# Patient Record
Sex: Female | Born: 1965 | Race: Black or African American | Hispanic: No | Marital: Married | Smoking: Former smoker
Health system: Southern US, Community
[De-identification: ages and names within clinical notes are randomized; demographics above are authoritative.]

## PROBLEM LIST (undated history)

## (undated) DIAGNOSIS — M1990 Unspecified osteoarthritis, unspecified site: Secondary | ICD-10-CM

## (undated) DIAGNOSIS — F419 Anxiety disorder, unspecified: Secondary | ICD-10-CM

## (undated) DIAGNOSIS — D219 Benign neoplasm of connective and other soft tissue, unspecified: Secondary | ICD-10-CM

## (undated) DIAGNOSIS — I10 Essential (primary) hypertension: Secondary | ICD-10-CM

## (undated) DIAGNOSIS — F32A Depression, unspecified: Secondary | ICD-10-CM

## (undated) DIAGNOSIS — R7303 Prediabetes: Secondary | ICD-10-CM

## (undated) DIAGNOSIS — F4310 Post-traumatic stress disorder, unspecified: Secondary | ICD-10-CM

## (undated) DIAGNOSIS — D649 Anemia, unspecified: Secondary | ICD-10-CM

## (undated) DIAGNOSIS — E119 Type 2 diabetes mellitus without complications: Secondary | ICD-10-CM

## (undated) HISTORY — DX: Anxiety disorder, unspecified: F41.9

## (undated) HISTORY — DX: Benign neoplasm of connective and other soft tissue, unspecified: D21.9

## (undated) HISTORY — DX: Post-traumatic stress disorder, unspecified: F43.10

## (undated) HISTORY — DX: Anemia, unspecified: D64.9

## (undated) HISTORY — DX: Essential (primary) hypertension: I10

## (undated) HISTORY — DX: Type 2 diabetes mellitus without complications: E11.9

---

## 2015-09-30 ENCOUNTER — Telehealth: Payer: Self-pay | Admitting: Obstetrics and Gynecology

## 2015-09-30 NOTE — Telephone Encounter (Signed)
Called and left a message for patient to call back to schedule a new patient doctor referral. °

## 2015-10-01 NOTE — Telephone Encounter (Signed)
Called but could not leave a message for patient to call back to schedule a new patient doctor referral because her voice mail was full.

## 2015-10-02 NOTE — Telephone Encounter (Signed)
Called but could not leave a message for patient to call back to schedule a new patient doctor referral because her voice mail was full.

## 2015-10-03 NOTE — Telephone Encounter (Signed)
Patient did not return repeated calls to schedule an appointment. Routing referral by fax back to referring office for them to follow up with the patient.

## 2015-10-30 ENCOUNTER — Encounter: Payer: BC Managed Care – PPO | Admitting: Obstetrics and Gynecology

## 2015-11-06 ENCOUNTER — Encounter: Payer: BC Managed Care – PPO | Admitting: Obstetrics and Gynecology

## 2015-11-06 ENCOUNTER — Ambulatory Visit (INDEPENDENT_AMBULATORY_CARE_PROVIDER_SITE_OTHER): Payer: BC Managed Care – PPO | Admitting: Obstetrics and Gynecology

## 2015-11-06 ENCOUNTER — Encounter: Payer: Self-pay | Admitting: Obstetrics and Gynecology

## 2015-11-06 VITALS — BP 118/64 | HR 88 | Resp 16 | Ht 60.5 in | Wt 294.0 lb

## 2015-11-06 DIAGNOSIS — Z01419 Encounter for gynecological examination (general) (routine) without abnormal findings: Secondary | ICD-10-CM | POA: Diagnosis not present

## 2015-11-06 DIAGNOSIS — Z124 Encounter for screening for malignant neoplasm of cervix: Secondary | ICD-10-CM

## 2015-11-06 DIAGNOSIS — B977 Papillomavirus as the cause of diseases classified elsewhere: Secondary | ICD-10-CM | POA: Diagnosis not present

## 2015-11-06 DIAGNOSIS — R888 Abnormal findings in other body fluids and substances: Secondary | ICD-10-CM

## 2015-11-06 DIAGNOSIS — IMO0002 Reserved for concepts with insufficient information to code with codable children: Secondary | ICD-10-CM

## 2015-11-06 NOTE — Patient Instructions (Signed)
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
## 2015-11-12 LAB — IPS PAP TEST WITH HPV

## 2015-11-14 NOTE — Addendum Note (Signed)
Addended by: Dorothy Spark on: 11/14/2015 02:57 PM   Modules accepted: Orders

## 2015-11-15 LAB — IPS HPV GENOTYPING 16/18

## 2015-11-21 ENCOUNTER — Telehealth: Payer: Self-pay

## 2015-11-21 NOTE — Telephone Encounter (Signed)
Spoke with patient. Results given as seen below from [place]. All questions answered. Offered to schedule an appointment with Dr.Jertson to discuss. Patient declines appointment at this time. 08 recall placed.  Routing to provider for final review. Patient agreeable to disposition. Will close encounter.

## 2015-11-21 NOTE — Telephone Encounter (Signed)
Left message to call Reagyn Facemire at [phone]. 

## 2015-11-21 NOTE — Telephone Encounter (Signed)
-----   Message from Salvadore Dom, MD sent at 11/20/2015 10:44 AM EST ----- Please inform the patient that her pap was negative, but the hpv test was positive. It was then further tested for the higher risk strains of hpv and those were negative. She needs another pap with hpv in 1 year.

## 2016-12-02 ENCOUNTER — Telehealth: Payer: Self-pay | Admitting: *Deleted

## 2016-12-02 NOTE — Telephone Encounter (Signed)
Patient in 08 recall 11/2016. Tried to contact patient- VM full- will try back -eh

## 2016-12-07 NOTE — Telephone Encounter (Signed)
Please send the patient a letter reminding her that she needs a f/u pap this year (if she has another provider, that's fine, just please let us know).

## 2016-12-07 NOTE — Telephone Encounter (Signed)
Tried to contact patient again- however VM is still full. Please advise on recall status/letter -eh

## 2016-12-08 ENCOUNTER — Encounter: Payer: Self-pay | Admitting: *Deleted

## 2016-12-08 NOTE — Telephone Encounter (Signed)
Recall letter sent to patient -eh

## 2017-02-25 ENCOUNTER — Encounter: Payer: Self-pay | Admitting: Registered"

## 2017-02-25 ENCOUNTER — Encounter: Payer: Self-pay | Attending: Physician Assistant | Admitting: Registered"

## 2017-02-25 DIAGNOSIS — Z713 Dietary counseling and surveillance: Secondary | ICD-10-CM | POA: Insufficient documentation

## 2017-02-25 DIAGNOSIS — E669 Obesity, unspecified: Secondary | ICD-10-CM

## 2017-02-25 NOTE — Progress Notes (Signed)
Medical Nutrition Therapy:  Appt start time: 1640 end time:  8295.   Assessment:  Primary concerns today: Pt referred for failure to lose weight . Pt says she has lost ~150 over a seven year period using weight watchers primarily in the past. Per pt meats makes her become bloated. Per pt fish gives her gout, but she eats it sometimes. Pt says she feels bad if she drinks milk and does not like it. Pt says she eats vegetarian most of the time and sometimes poultry and fish. Pt has tried gluten-free diet because per pt she is concerned it affects her uric acid levels. Pt says she currently consumes a protein shake in the morning with a total of 45g of protein. Pt says her husband has lost weight as well and they both work out together.   Preferred Learning Style:   No preferred style indicated  Learning Readiness:  Ready  MEDICATIONS: See list   DIETARY INTAKE:  Usual eating pattern includes 2 meals and sometimes 1 snacks per day.  Everyday foods include beans, nuts.  Avoided foods include milk, red meats, tofu.    24-hr recall:  B ( AM): 2 100 kcal packs of almonds, banana or other fruit, water or a protein shake Snk ( AM):  None reported  L ( PM): None unless pt skips breakfast then it will be pre-made meal Snk ( PM): Small snack of skittles but pt says she does not usually eat afternoon snack D ( PM): vegetarian spaghetti with gluten free noodles and kale caesar salad Snk ( PM): None reported.  Beverages: water, crystal lite with lemon carbonated water, stevia sweetened  Pt says she usually eats at the table.  Pt says on the weekends she cooks at home.  Pt says she feels like she is starving when she gets home at dinner and it sometimes causes her to overeat at dinner and crave carbohydrates. Pt likes beans but does not like tofu.   Usual physical activity: Walks about 2-5,000 steps per day per pt and does 15-20 minutes weight baring exercises every week day.    Progress  Towards Goal(s):  In progress.   Nutritional Diagnosis:  NI-5.11.1 Predicted suboptimal nutrient intake As related to pt only consuming 2 meals per day.  As evidenced by pt diet recall.    Intervention:  Nutrition Counseling provided. Dietitian counseled pt on importance of eating 3 regular meals each day to provide adequate energy and to promote balanced metabolism. Dietitian talked to pt about importance of eating a balanced diet and nourishing her body and focusing on health rather than weight. Dietitian provided suggestions on how to plan balanced meals. Dietitian discussed sources of vitamin D in diet and through sunlight and suggested pt consume probiotics to promote digestive health.   Goals:   -Get in three meals per day and a snack if there are long periods between meals.   -Breakfast ideas-toast with peanut butter and a piece of fruit, egg with toast and a piece of fruit, yogurt with granola and a piece of fruit, or protein shake if more convenient and unable to make a breakfast.   -Get in lunch in the middle of the day to provide sufficient energy and to help avoid feeling too hungry in the evening. Make sure to include some carbohydrates in each meals to provide energy for the brain and body.   -Try to make sure to get in water without caffeine throughout the day to ensure proper hydration.   -  Try Culturelle probiotics, yogurt with live active cultures, or Kefir.   -Continue getting in regular physical activity.   Teaching Method Utilized:  Auditory   Handouts given during visit include:  My Plate Handout  Barriers to learning/adherence to lifestyle change: Busy lifestyle.  Demonstrated degree of understanding via:  Teach Back   Monitoring/Evaluation:  Dietary intake, exercise,  and body weight in 1 month(s).

## 2017-02-25 NOTE — Patient Instructions (Addendum)
Get in three meals per day and a snack if there are long periods between meals.   Breakfast ideas-toast with peanut butter and a piece of fruit, egg with toast and a piece of fruit, yogurt with granola and a piece of fruit, or protein shake if more convenient and unable to make a breakfast.   Get in lunch in the middle of the day to provide sufficient energy and to help avoid feeling too hungry in the evening. Make sure to include some carbohydrates in each meals to provide energy for the brain and body.   Try to make sure to get in water without caffeine throughout the day to ensure proper hydration.   Try Culturelle probiotics, yogurt with live active cultures, or Kefir.   Continue getting in regular physical activity.

## 2017-03-23 ENCOUNTER — Ambulatory Visit: Payer: Self-pay | Admitting: Registered"

## 2018-01-08 ENCOUNTER — Ambulatory Visit (HOSPITAL_COMMUNITY)
Admission: EM | Admit: 2018-01-08 | Discharge: 2018-01-08 | Disposition: A | Discharge status: Home / Self Care | Payer: BC Managed Care – PPO | Attending: Family Medicine | Admitting: Family Medicine

## 2018-01-08 ENCOUNTER — Encounter (HOSPITAL_COMMUNITY): Payer: Self-pay | Admitting: Family Medicine

## 2018-01-08 DIAGNOSIS — N12 Tubulo-interstitial nephritis, not specified as acute or chronic: Secondary | ICD-10-CM | POA: Diagnosis not present

## 2018-01-08 DIAGNOSIS — R509 Fever, unspecified: Secondary | ICD-10-CM | POA: Diagnosis not present

## 2018-01-08 DIAGNOSIS — R300 Dysuria: Secondary | ICD-10-CM

## 2018-01-08 LAB — POCT URINALYSIS DIP (DEVICE)
Bilirubin Urine: NEGATIVE
Glucose, UA: NEGATIVE mg/dL
HGB URINE DIPSTICK: NEGATIVE
Ketones, ur: NEGATIVE mg/dL
Leukocytes, UA: NEGATIVE
NITRITE: NEGATIVE
PH: 7.5 (ref 5.0–8.0)
Protein, ur: NEGATIVE mg/dL
SPECIFIC GRAVITY, URINE: 1.015 (ref 1.005–1.030)
UROBILINOGEN UA: 0.2 mg/dL (ref 0.0–1.0)

## 2018-01-08 MED ORDER — CEFTRIAXONE SODIUM 1 G IJ SOLR
1.0000 g | Freq: Once | INTRAMUSCULAR | Status: AC
  Administered 2018-01-08: 1 g via INTRAMUSCULAR

## 2018-01-08 MED ORDER — LIDOCAINE HCL (PF) 1 % IJ SOLN
INTRAMUSCULAR | Status: AC
Start: 2018-01-08 — End: 2018-01-08
  Filled 2018-01-08: qty 2

## 2018-01-08 MED ORDER — CEFTRIAXONE SODIUM 1 G IJ SOLR
INTRAMUSCULAR | Status: AC
  Filled 2018-01-08: qty 10

## 2018-01-08 MED ORDER — SULFAMETHOXAZOLE-TRIMETHOPRIM 800-160 MG PO TABS: 1.0000 | ORAL_TABLET | Freq: Two times a day (BID) | ORAL | 0 refills | Status: AC

## 2018-01-08 NOTE — ED Provider Notes (Signed)
  [facility]    CSN: [id number] Arrival date & time: 01/08/18  6195  Chief Complaint  Patient presents with  . Fever  . Back Pain    Evelyn Butler is a 52 y.o. female here for possible UTI.  Duration: 6 days. Symptoms: fever, flank pain on right and dysuria Denies: urinary frequency, hematuria, urinary hesitancy, urinary retention, nausea, vomiting and URI s/s's, vaginal discharge Hx of recurrent UTI? No Denies new sexual partners. She was treated for a UTI 6 days ago with Macrobid, they were unable to tell her what organism was causing her UTI on culture.  She started having fevers and flank pain 2 days ago.  ROS:  Constitutional: +fever GU: As noted in HPI  Past Medical History:  Diagnosis Date  . Anemia   . Diabetes mellitus without complication ([place])   . Fibroid   . Hypertension     BP 135/76   Pulse 92   Temp 99.3 F (37.4 C)   Resp 18   Wt 288 lb (130.6 kg)   SpO2 100%   BMI 52.68 kg/m  General: Awake, alert, appears stated age HEENT: MMM Heart: RRR, no murmurs Lungs: CTAB, normal respiratory effort, no accessory muscle usage Abd: BS+, soft, NT, ND, no masses or organomegaly MSK: +R CVA tenderness Psych: Age appropriate judgment and insight  Pyelonephritis  UA neg. 1 g Rocephin given in clinic, start Bactrim DS bid for 7 d tomorrow.  Stay hydrated. Seek immediate care if pt starts to develop new/worsening symptoms, uncontrollable N/V. F/u w pcp prn. The patient voiced understanding and agreement to the plan.    Shelda Pal, [place] 01/08/18 2151

## 2018-01-08 NOTE — ED Triage Notes (Addendum)
Pt here for fever and back pain. Was treated for UTI last Sunday. She has been taking Macrobid. sts Thursday she began to have a fever 101.5. Pt also has bilateral LE pain. Reports hx of the same with cellulitis. Took tylenol 2 hours ago.

## 2018-01-09 ENCOUNTER — Telehealth (HOSPITAL_COMMUNITY): Payer: Self-pay | Admitting: Emergency Medicine

## 2018-01-09 MED ORDER — CEPHALEXIN 500 MG PO CAPS: 500.0000 mg | ORAL_CAPSULE | Freq: Four times a day (QID) | ORAL | 0 refills | Status: AC

## 2018-01-09 NOTE — Telephone Encounter (Signed)
Called in RX to [facility] on [address]

## 2018-01-09 NOTE — Telephone Encounter (Signed)
Pt called and is allergic to sulfa and needs new antibiotic called into pharmacy

## 2018-05-29 ENCOUNTER — Encounter (HOSPITAL_COMMUNITY): Payer: Self-pay

## 2018-05-29 ENCOUNTER — Emergency Department (HOSPITAL_COMMUNITY)
Admission: EM | Admit: 2018-05-29 | Discharge: 2018-05-29 | Disposition: A | Discharge status: Home / Self Care | Payer: BC Managed Care – PPO | Attending: Emergency Medicine | Admitting: Emergency Medicine

## 2018-05-29 ENCOUNTER — Emergency Department (HOSPITAL_COMMUNITY): Payer: BC Managed Care – PPO

## 2018-05-29 ENCOUNTER — Other Ambulatory Visit: Payer: Self-pay

## 2018-05-29 DIAGNOSIS — Z87891 Personal history of nicotine dependence: Secondary | ICD-10-CM | POA: Diagnosis not present

## 2018-05-29 DIAGNOSIS — M1612 Unilateral primary osteoarthritis, left hip: Secondary | ICD-10-CM | POA: Diagnosis not present

## 2018-05-29 DIAGNOSIS — M25561 Pain in right knee: Secondary | ICD-10-CM

## 2018-05-29 DIAGNOSIS — W19XXXA Unspecified fall, initial encounter: Secondary | ICD-10-CM

## 2018-05-29 DIAGNOSIS — M1711 Unilateral primary osteoarthritis, right knee: Secondary | ICD-10-CM | POA: Diagnosis not present

## 2018-05-29 DIAGNOSIS — E119 Type 2 diabetes mellitus without complications: Secondary | ICD-10-CM | POA: Insufficient documentation

## 2018-05-29 DIAGNOSIS — I10 Essential (primary) hypertension: Secondary | ICD-10-CM | POA: Insufficient documentation

## 2018-05-29 DIAGNOSIS — Z79899 Other long term (current) drug therapy: Secondary | ICD-10-CM | POA: Insufficient documentation

## 2018-05-29 DIAGNOSIS — M25552 Pain in left hip: Secondary | ICD-10-CM

## 2018-05-29 MED ORDER — PREDNISONE 10 MG (21) PO TBPK: ORAL_TABLET | Freq: Every day | ORAL | 0 refills | Status: DC

## 2018-05-29 MED ORDER — DEXAMETHASONE SODIUM PHOSPHATE 10 MG/ML IJ SOLN
10.0000 mg | Freq: Once | INTRAMUSCULAR | Status: AC
  Administered 2018-05-29: 10 mg via INTRAMUSCULAR
  Filled 2018-05-29: qty 1

## 2018-05-29 NOTE — ED Notes (Signed)
Pt verbalized understanding of discharge instructions and denies any further questions at this time.   

## 2018-05-29 NOTE — Discharge Instructions (Addendum)
Thank you for allowing me to care for you today in the Emergency Department.   Starting tomorrow, take 6 tabs of prednisone by mouth daily  for 2 days, then 5 tabs for 2 days, then 4 tabs for 2 days, then 3 tabs for 2 days, 2 tabs for 2 days, then 1 tab by mouth daily for 2 days.  Please note, this medication can cause her blood sugar to increase.  You can also try applying capsaicin cream, which is available over-the-counter, to areas that are sore.  Use as directed on the label.  You can take at thousand milligrams of regular Tylenol every 8 hours.  If you use extra strength or Tylenol for arthritis, use as directed on the label.  Apply ice for 15 to 20 minutes as frequently as needed to help with pain and swelling.  Wear the knee sleeve as needed to provide compression to help with pain.  Call to schedule follow-up appointment with Dr. Stann Mainland, and orthopedist, if your symptoms do not start to improve in the next 5 to 7 days.  Return to the emergency department if you develop new or worsening symptoms including any fall or injury, new numbness or weakness, or if any of the joints get red, hot, or swollen to the touch.

## 2018-05-29 NOTE — ED Notes (Addendum)
Patient transported to XR. 
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
## 2018-05-29 NOTE — ED Notes (Signed)
ED Provider at bedside. 

## 2020-08-01 ENCOUNTER — Other Ambulatory Visit: Payer: Self-pay | Admitting: Family Medicine

## 2020-08-02 ENCOUNTER — Other Ambulatory Visit: Payer: Self-pay | Admitting: Family Medicine

## 2020-08-02 DIAGNOSIS — Z1231 Encounter for screening mammogram for malignant neoplasm of breast: Secondary | ICD-10-CM

## 2021-06-20 NOTE — Patient Instructions (Addendum)
DUE TO COVID-19 ONLY ONE VISITOR IS ALLOWED TO COME WITH YOU AND STAY IN THE WAITING ROOM ONLY DURING PRE OP AND PROCEDURE DAY OF SURGERY. THE 2 VISITORS  MAY VISIT WITH YOU AFTER SURGERY IN YOUR PRIVATE ROOM DURING VISITING HOURS ONLY!  YOU NEED TO HAVE A COVID 19 TEST ON__8/29_____ THIS TEST MUST BE DONE BEFORE SURGERY,                 Evelyn Butler [facility]     Your procedure is scheduled on: 07/02/21   Report to [facility] Main  Entrance   Report to admitting at  6:45 AM     Call this number if you have problems the morning of surgery [phone]   . BRUSH YOUR TEETH MORNING OF SURGERY AND RINSE YOUR MOUTH OUT, NO CHEWING GUM CANDY OR MINTS.   No food after midnight.    You may have clear liquid until 6:30 AM.    At 6:00 AM drink pre surgery drink.   Nothing by mouth after 6:30 AM.     Take these medicines the morning of surgery with A SIP OF WATER: none                                You may not have any metal on your body including hair pins and              piercings  Do not wear jewelry, make-up, lotions, powders or perfumes, deodorant             Do not wear nail polish on your fingernails and toes .  Do not shave  48 hours prior to surgery.                 Do not bring valuables to the hospital. [place].  Contacts, dentures or bridgework may not be worn into surgery.  Leave suitcase in the car. After surgery it may be brought to your room.      Special Instructions: N/A              Please read over the following fact sheets you were given: _____________________________________________________________________             [facility] - Preparing for Surgery Before surgery, you can play an important role.  Because skin is not sterile, your skin needs to be as free of germs as possible.  You can reduce the number of germs on your skin by washing with CHG (chlorahexidine gluconate) soap before  surgery.  CHG is an antiseptic cleaner which kills germs and bonds with the skin to continue killing germs even after washing. Please DO NOT use if you have an allergy to CHG or antibacterial soaps.  If your skin becomes reddened/irritated stop using the CHG and inform your nurse when you arrive at Short Stay. Do not shave (including legs and underarms) for at least 48 hours prior to the first CHG shower.   Please follow these instructions carefully:  1.  Shower with CHG Soap the night before surgery and the  morning of Surgery.  2.  If you choose to wash your hair, wash your hair first as usual with your  normal  shampoo.  3.  After you shampoo, rinse your hair and body thoroughly to remove the  shampoo.                            4.  Use CHG as you would any other liquid soap.  You can apply chg directly  to the skin and wash                       Gently with a scrungie or clean washcloth.  5.  Apply the CHG Soap to your body ONLY FROM THE NECK DOWN.   Do not use on face/ open                           Wound or open sores. Avoid contact with eyes, ears mouth and genitals (private parts).                       Wash face,  Genitals (private parts) with your normal soap.             6.  Wash thoroughly, paying special attention to the area where your surgery  will be performed.  7.  Thoroughly rinse your body with warm water from the neck down.  8.  DO NOT shower/wash with your normal soap after using and rinsing off  the CHG Soap.             9.  Pat yourself dry with a clean towel.            10.  Wear clean pajamas.            11.  Place clean sheets on your bed the night of your first shower and do not  sleep with pets. Day of Surgery : Do not apply any lotions/deodorants the morning of surgery.  Please wear clean clothes to the hospital/surgery center.  FAILURE TO FOLLOW THESE INSTRUCTIONS MAY RESULT IN THE CANCELLATION OF YOUR SURGERY PATIENT  SIGNATURE_________________________________  NURSE SIGNATURE__________________________________  ________________________________________________________________________   Adam Phenix  An incentive spirometer is a tool that can help keep your lungs clear and active. This tool measures how well you are filling your lungs with each breath. Taking long deep breaths may help reverse or decrease the chance of developing breathing (pulmonary) problems (especially infection) following: A long period of time when you are unable to move or be active. BEFORE THE PROCEDURE  If the spirometer includes an indicator to show your best effort, your nurse or respiratory therapist will set it to a desired goal. If possible, sit up straight or lean slightly forward. Try not to slouch. Hold the incentive spirometer in an upright position. INSTRUCTIONS FOR USE  Sit on the edge of your bed if possible, or sit up as far as you can in bed or on a chair. Hold the incentive spirometer in an upright position. Breathe out normally. Place the mouthpiece in your mouth and seal your lips tightly around it. Breathe in slowly and as deeply as possible, raising the piston or the ball toward the top of the column. Hold your breath for 3-5 seconds or for as long as possible. Allow the piston or ball to fall to the bottom of the column. Remove the mouthpiece from your mouth and breathe out normally. Rest for a few seconds and repeat Steps 1 through 7 at least 10 times every 1-2 hours when you are awake. Take your time and take a few normal  breaths between deep breaths. The spirometer may include an indicator to show your best effort. Use the indicator as a goal to work toward during each repetition. After each set of 10 deep breaths, practice coughing to be sure your lungs are clear. If you have an incision (the cut made at the time of surgery), support your incision when coughing by placing a pillow or rolled up towels  firmly against it. Once you are able to get out of bed, walk around indoors and cough well. You may stop using the incentive spirometer when instructed by your caregiver.  RISKS AND COMPLICATIONS Take your time so you do not get dizzy or light-headed. If you are in pain, you may need to take or ask for pain medication before doing incentive spirometry. It is harder to take a deep breath if you are having pain. AFTER USE Rest and breathe slowly and easily. It can be helpful to keep track of a log of your progress. Your caregiver can provide you with a simple table to help with this. If you are using the spirometer at home, follow these instructions: [place] IF:  You are having difficultly using the spirometer. You have trouble using the spirometer as often as instructed. Your pain medication is not giving enough relief while using the spirometer. You develop fever of 100.5 F (38.1 C) or higher. SEEK IMMEDIATE MEDICAL CARE IF:  You cough up bloody sputum that had not been present before. You develop fever of 102 F (38.9 C) or greater. You develop worsening pain at or near the incision site. MAKE SURE YOU:  Understand these instructions. Will watch your condition. Will get help right away if you are not doing well or get worse. Document Released: 03/01/2007 Document Revised: 01/11/2012 Document Reviewed: 05/02/2007 [facility] Patient Information 2014 [place], [place].   ________________________________________________________________________

## 2021-06-23 ENCOUNTER — Encounter (HOSPITAL_COMMUNITY): Payer: Self-pay

## 2021-06-23 ENCOUNTER — Encounter (HOSPITAL_COMMUNITY)
Admission: RE | Admit: 2021-06-23 | Discharge: 2021-06-23 | Disposition: A | Discharge status: Home / Self Care | Payer: BC Managed Care – PPO | Source: Ambulatory Visit | Attending: Orthopedic Surgery | Admitting: Orthopedic Surgery

## 2021-06-23 ENCOUNTER — Other Ambulatory Visit: Payer: Self-pay

## 2021-06-23 DIAGNOSIS — Z01812 Encounter for preprocedural laboratory examination: Secondary | ICD-10-CM | POA: Diagnosis present

## 2021-06-23 HISTORY — DX: Prediabetes: R73.03

## 2021-06-23 LAB — CBC
HCT: 36.9 % (ref 36.0–46.0)
Hemoglobin: 12.1 g/dL (ref 12.0–15.0)
MCH: 28.7 pg (ref 26.0–34.0)
MCHC: 32.8 g/dL (ref 30.0–36.0)
MCV: 87.4 fL (ref 80.0–100.0)
Platelets: 302 10*3/uL (ref 150–400)
RBC: 4.22 MIL/uL (ref 3.87–5.11)
RDW: 13.7 % (ref 11.5–15.5)
WBC: 5 10*3/uL (ref 4.0–10.5)
nRBC: 0 % (ref 0.0–0.2)

## 2021-06-23 LAB — PROTIME-INR
INR: 1 (ref 0.8–1.2)
Prothrombin Time: 12.9 seconds (ref 11.4–15.2)

## 2021-06-23 LAB — COMPREHENSIVE METABOLIC PANEL
ALT: 16 U/L (ref 0–44)
AST: 16 U/L (ref 15–41)
Albumin: 4.3 g/dL (ref 3.5–5.0)
Alkaline Phosphatase: 60 U/L (ref 38–126)
Anion gap: 6 (ref 5–15)
BUN: 31 mg/dL — ABNORMAL HIGH (ref 6–20)
CO2: 24 mmol/L (ref 22–32)
Calcium: 9.3 mg/dL (ref 8.9–10.3)
Chloride: 105 mmol/L (ref 98–111)
Creatinine, Ser: 1.01 mg/dL — ABNORMAL HIGH (ref 0.44–1.00)
GFR, Estimated: >60 mL/min (ref >60)
Glucose, Bld: 83 mg/dL (ref 70–99)
Potassium: 4.6 mmol/L (ref 3.5–5.1)
Sodium: 135 mmol/L (ref 135–145)
Total Bilirubin: 0.9 mg/dL (ref 0.3–1.2)
Total Protein: 8 g/dL (ref 6.5–8.1)

## 2021-06-23 LAB — HEMOGLOBIN A1C
Hgb A1c MFr Bld: 4.9 % (ref 4.8–5.6)
Mean Plasma Glucose: 93.93 mg/dL

## 2021-06-23 LAB — APTT: aPTT: 36 seconds (ref 24–36)

## 2021-06-23 LAB — SURGICAL PCR SCREEN
MRSA, PCR: NEGATIVE
Staphylococcus aureus: NEGATIVE
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
## 2021-06-30 ENCOUNTER — Other Ambulatory Visit: Payer: Self-pay | Admitting: Orthopedic Surgery

## 2021-07-01 LAB — SARS CORONAVIRUS 2 (TAT 6-24 HRS): SARS Coronavirus 2: NEGATIVE

## 2021-07-01 NOTE — H&P (Signed)
TOTAL HIP ADMISSION H&P  Patient is admitted for left total hip arthroplasty.  Subjective:  Chief Complaint: Left hip pain  HPI: [place], 55 y.o. female, has a history of pain and functional disability in the left hip due to arthritis and patient has failed non-surgical conservative treatments for greater than 12 weeks to include NSAID's and/or analgesics, weight reduction as appropriate, and activity modification. Onset of symptoms was gradual, starting  several  years ago with gradually worsening course since that time. The patient noted no past surgery on the left hip. Patient currently rates pain in the left hip at 9 out of 10 with activity. Patient has worsening of pain with activity and weight bearing, pain that interfers with activities of daily living, and pain with passive range of motion. Patient has evidence of subchondral cysts, periarticular osteophytes, and joint subluxation by imaging studies. This condition presents safety issues increasing the risk of falls. There is no current active infection.  There are no problems to display for this patient.   Past Medical History:  Diagnosis Date   Fibroid    Hypertension    Pre-diabetes     Past Surgical History:  Procedure Laterality Date   CESAREAN SECTION     x 2    Prior to Admission medications   Medication Sig Start Date End Date Taking? Authorizing Provider  Cholecalciferol (DIALYVITE VITAMIN D 5000) 125 MCG (5000 UT) capsule Take 15,000 Units by mouth daily.   Yes [provider]  hydrochlorothiazide (MICROZIDE) 12.5 MG capsule Take 12.5 mg by mouth daily.   Yes [provider]  lisinopril (ZESTRIL) 10 MG tablet Take 10 mg by mouth daily.   Yes [provider]  meloxicam (MOBIC) 7.5 MG tablet Take 7.5 mg by mouth 2 (two) times daily.   Yes [provider]  Multiple Vitamins-Minerals (ADULT GUMMY PO) Take 2 capsules by mouth daily.   Yes [provider]   traMADol (ULTRAM) 50 MG tablet Take 50 mg by mouth 4 (four) times daily.   Yes [provider]    Allergies  Allergen Reactions   Codeine Anaphylaxis, Shortness Of Breath and Nausea And Vomiting    Closes throat    Gluten Meal     Angioedema and joint swelling   Monosodium Glutamate Swelling    MSG    Sulfa Antibiotics Nausea And Vomiting    Migraines   Levofloxacin     Facial Swelling   Other Rash    Mesh bed padding     Social History   Socioeconomic History   Marital status: Married    Spouse name: Not on file   Number of children: Not on file   Years of education: Not on file   Highest education level: Not on file  Occupational History   Not on file  Tobacco Use   Smoking status: Former    Packs/day: 0.25    Years: 3.00    Pack years: 0.75    Types: Cigarettes    Quit date: 02/01/1995    Years since quitting: 26.4   Smokeless tobacco: Never  Vaping Use   Vaping Use: Never used  Substance and Sexual Activity   Alcohol use: No    Alcohol/week: 0.0 standard drinks   Drug use: No   Sexual activity: Yes    Partners: Male  Other Topics Concern   Not on file  Social History Narrative   Not on file   Social Determinants of Health   Financial  Resource Strain: Not on file  Food Insecurity: Not on file  Transportation Needs: Not on file  Physical Activity: Not on file  Stress: Not on file  Social Connections: Not on file  Intimate Partner Violence: Not on file    Tobacco Use: Medium Risk   Smoking Tobacco Use: Former   Smokeless Tobacco Use: Never   Social History   Substance and Sexual Activity  Alcohol Use No   Alcohol/week: 0.0 standard drinks    Family History  Problem Relation Age of Onset   Diabetes Mother    Hypertension Mother    Hypertension Father    Heart disease Father    Epilepsy Maternal Grandmother    Colon cancer Paternal Grandfather     ROS: Constitutional: no fever, no chills, no night sweats, no significant  weight loss Cardiovascular: no chest pain, no palpitations Respiratory: no cough, no shortness of breath, No COPD Gastrointestinal: no vomiting, no nausea Musculoskeletal: no swelling in Joints, Joint Pain Neurologic: no numbness, no tingling, no difficulty with balance    Objective:  Physical Exam: General: Alert and oriented x3, cooperative and pleasant, no acute distress.  Head: normocephalic, atraumatic, neck supple.  Eyes: EOMI.  Respiratory: breath sounds clear in all fields, no wheezing, rales, or rhonchi. Cardiovascular: Regular rate and rhythm, no murmurs, gallops or rubs.  Abdomen: non-tender to palpation and soft, normoactive bowel sounds. Musculoskeletal:  The patient is sitting in a wheelchair today.      Right Hip Exam:   The range of motion: Flexion to 95 degrees, Internal Rotation to 10 degrees, External Rotation to 10 degrees, and abduction to 20 degrees without discomfort.       Left Hip Exam:   The range of motion: Flexion to 90 degrees, Internal Rotation to 0 degrees, External Rotation to 0 degrees, and abduction to 0 degrees.     There is no tenderness over the greater trochanteric bursa.   Calves soft and nontender. Motor function intact in LE. Strength 5/5 LE bilaterally. Neuro: Distal pulses 2+. Sensation to light touch intact in LE.  Vital signs in last 24 hours:    Imaging Review Radiographs- AP pelvis, AP and lateral of the bilateral hips dated 03/23/2021 demonstrate severe end-stage bone-on-bone arthritis in both hips, left slightly worse than the right. She is almost fused on the x-ray.  Assessment/Plan:  End stage arthritis, left hip  The patient history, physical examination, clinical judgement of the provider and imaging studies are consistent with end stage degenerative joint disease of the left hip and total hip arthroplasty is deemed medically necessary. The treatment options including medical management, injection therapy,  arthroscopy and arthroplasty were discussed at length. The risks and benefits of total hip arthroplasty were presented and reviewed. The risks due to aseptic loosening, infection, stiffness, dislocation/subluxation, thromboembolic complications and other imponderables were discussed. The patient acknowledged the explanation, agreed to proceed with the plan and consent was signed. Patient is being admitted for inpatient treatment for surgery, pain control, PT, OT, prophylactic antibiotics, VTE prophylaxis, progressive ambulation and ADLs and discharge planning.The patient is planning to be discharged home with home health services.   Patient's anticipated LOS is less than 2 midnights, meeting these requirements: - Younger than 47 - Lives within 1 hour of care - Has a competent adult at home to recover with post-op recover - NO history of  - Chronic pain requiring opiods  - Diabetes  - Coronary Artery Disease  - Heart failure  - Heart attack  -  Stroke  - DVT/VTE  - Cardiac arrhythmia  - Respiratory Failure/COPD  - Renal failure  - Anemia  - Advanced Liver disease    Therapy Plans: Home Health PT Disposition: TBD - Home with husband, Does not want SNF, but has no support at home.  Planned DVT Prophylaxis: Aspirin '325mg'$  DME Needed: None PCP: Dr. Ayesha Rumpf (Clearance received) TXA: IV Allergies: Codeine (respiratory distress, moderate severe), gluten, sulfa abx Anesthesia Concerns: None BMI: 47.3 Last HgbA1c: N/A  Pharmacy: [facility] on [address]  - Patient was instructed on what medications to stop prior to surgery. - Follow-up visit in 2 weeks with Dr. Wynelle Link - Begin physical therapy following surgery - Pre-operative lab work as pre-surgical testing - Prescriptions will be provided in hospital at time of discharge  Fenton Foy, [facility], PA-C Orthopedic Surgery [facility] [place]

## 2021-07-01 NOTE — Anesthesia Preprocedure Evaluation (Addendum)
Anesthesia Evaluation  Patient identified by MRN, date of birth, ID band Patient awake    Reviewed: Allergy & Precautions, NPO status , Patient's Chart, lab work & pertinent test results  Airway Mallampati: I  TM Distance: >3 FB Neck ROM: Full    Dental  (+) Dental Advisory Given   Pulmonary former smoker,    breath sounds clear to auscultation       Cardiovascular hypertension, Pt. on medications  Rhythm:Regular Rate:Normal     Neuro/Psych negative neurological ROS     GI/Hepatic negative GI ROS, Neg liver ROS,   Endo/Other  Morbid obesity  Renal/GU negative Renal ROS     Musculoskeletal  (+) Arthritis ,   Abdominal   Peds  Hematology negative hematology ROS (+)   Anesthesia Other Findings   Reproductive/Obstetrics                            Lab Results  Component Value Date   WBC 5.0 06/23/2021   HGB 12.1 06/23/2021   HCT 36.9 06/23/2021   MCV 87.4 06/23/2021   PLT 302 06/23/2021   Lab Results  Component Value Date   CREATININE 1.01 (H) 06/23/2021   BUN 31 (H) 06/23/2021   NA 135 06/23/2021   K 4.6 06/23/2021   CL 105 06/23/2021   CO2 24 06/23/2021    Anesthesia Physical Anesthesia Plan  ASA: 3  Anesthesia Plan: Spinal   Post-op Pain Management:    Induction:   PONV Risk Score and Plan: 2 and Propofol infusion, Ondansetron and Treatment may vary due to age or medical condition  Airway Management Planned: Natural Airway and Simple Face Mask  Additional Equipment:   Intra-op Plan:   Post-operative Plan:   Informed Consent: I have reviewed the patients History and Physical, chart, labs and discussed the procedure including the risks, benefits and alternatives for the proposed anesthesia with the patient or authorized representative who has indicated his/her understanding and acceptance.       Plan Discussed with: CRNA  Anesthesia Plan Comments:         Anesthesia Quick Evaluation

## 2021-07-02 ENCOUNTER — Inpatient Hospital Stay (HOSPITAL_COMMUNITY)
Admission: RE | Admit: 2021-07-02 | Discharge: 2021-07-07 | DRG: 470 | Disposition: A | Discharge status: Home Health | Payer: BC Managed Care – PPO | Attending: Orthopedic Surgery | Admitting: Orthopedic Surgery

## 2021-07-02 ENCOUNTER — Other Ambulatory Visit: Payer: Self-pay

## 2021-07-02 ENCOUNTER — Encounter (HOSPITAL_COMMUNITY): Payer: Self-pay | Admitting: Orthopedic Surgery

## 2021-07-02 ENCOUNTER — Encounter (HOSPITAL_COMMUNITY)
Admission: RE | Disposition: A | Discharge status: Home Health | Payer: Self-pay | Source: Home / Self Care | Attending: Orthopedic Surgery

## 2021-07-02 ENCOUNTER — Observation Stay (HOSPITAL_COMMUNITY): Payer: BC Managed Care – PPO

## 2021-07-02 ENCOUNTER — Ambulatory Visit (HOSPITAL_COMMUNITY): Payer: BC Managed Care – PPO | Admitting: Anesthesiology

## 2021-07-02 DIAGNOSIS — Z87891 Personal history of nicotine dependence: Secondary | ICD-10-CM

## 2021-07-02 DIAGNOSIS — Z91048 Other nonmedicinal substance allergy status: Secondary | ICD-10-CM

## 2021-07-02 DIAGNOSIS — Z791 Long term (current) use of non-steroidal anti-inflammatories (NSAID): Secondary | ICD-10-CM

## 2021-07-02 DIAGNOSIS — Z882 Allergy status to sulfonamides status: Secondary | ICD-10-CM

## 2021-07-02 DIAGNOSIS — M1612 Unilateral primary osteoarthritis, left hip: Secondary | ICD-10-CM | POA: Diagnosis present

## 2021-07-02 DIAGNOSIS — Z96642 Presence of left artificial hip joint: Secondary | ICD-10-CM | POA: Diagnosis present

## 2021-07-02 DIAGNOSIS — R7303 Prediabetes: Secondary | ICD-10-CM | POA: Diagnosis present

## 2021-07-02 DIAGNOSIS — I10 Essential (primary) hypertension: Secondary | ICD-10-CM | POA: Diagnosis present

## 2021-07-02 DIAGNOSIS — Z881 Allergy status to other antibiotic agents status: Secondary | ICD-10-CM

## 2021-07-02 DIAGNOSIS — Z7982 Long term (current) use of aspirin: Secondary | ICD-10-CM

## 2021-07-02 DIAGNOSIS — Z8249 Family history of ischemic heart disease and other diseases of the circulatory system: Secondary | ICD-10-CM

## 2021-07-02 DIAGNOSIS — Z888 Allergy status to other drugs, medicaments and biological substances status: Secondary | ICD-10-CM

## 2021-07-02 DIAGNOSIS — Z885 Allergy status to narcotic agent status: Secondary | ICD-10-CM

## 2021-07-02 DIAGNOSIS — Z6841 Body Mass Index (BMI) 40.0 and over, adult: Secondary | ICD-10-CM

## 2021-07-02 DIAGNOSIS — Z833 Family history of diabetes mellitus: Secondary | ICD-10-CM

## 2021-07-02 DIAGNOSIS — D62 Acute posthemorrhagic anemia: Secondary | ICD-10-CM | POA: Diagnosis not present

## 2021-07-02 DIAGNOSIS — Z91018 Allergy to other foods: Secondary | ICD-10-CM

## 2021-07-02 DIAGNOSIS — M169 Osteoarthritis of hip, unspecified: Secondary | ICD-10-CM

## 2021-07-02 DIAGNOSIS — Z79899 Other long term (current) drug therapy: Secondary | ICD-10-CM

## 2021-07-02 DIAGNOSIS — Z96649 Presence of unspecified artificial hip joint: Secondary | ICD-10-CM

## 2021-07-02 HISTORY — PX: TOTAL HIP ARTHROPLASTY: SHX124

## 2021-07-02 LAB — COMPREHENSIVE METABOLIC PANEL
ALT: 14 U/L (ref 0–44)
AST: 22 U/L (ref 15–41)
Albumin: 3.6 g/dL (ref 3.5–5.0)
Alkaline Phosphatase: 53 U/L (ref 38–126)
Anion gap: 7 (ref 5–15)
BUN: 18 mg/dL (ref 6–20)
CO2: 22 mmol/L (ref 22–32)
Calcium: 9.2 mg/dL (ref 8.9–10.3)
Chloride: 108 mmol/L (ref 98–111)
Creatinine, Ser: 0.7 mg/dL (ref 0.44–1.00)
GFR, Estimated: >60 mL/min (ref >60)
Glucose, Bld: 98 mg/dL (ref 70–99)
Potassium: 3.8 mmol/L (ref 3.5–5.1)
Sodium: 137 mmol/L (ref 135–145)
Total Bilirubin: 0.5 mg/dL (ref 0.3–1.2)
Total Protein: 6.8 g/dL (ref 6.5–8.1)

## 2021-07-02 LAB — CBC
HCT: 38.7 % (ref 36.0–46.0)
Hemoglobin: 12.8 g/dL (ref 12.0–15.0)
MCH: 30.5 pg (ref 26.0–34.0)
MCHC: 33.1 g/dL (ref 30.0–36.0)
MCV: 92.1 fL (ref 80.0–100.0)
Platelets: 248 10*3/uL (ref 150–400)
RBC: 4.2 MIL/uL (ref 3.87–5.11)
RDW: 14.1 % (ref 11.5–15.5)
WBC: 15.5 10*3/uL — ABNORMAL HIGH (ref 4.0–10.5)
nRBC: 0 % (ref 0.0–0.2)

## 2021-07-02 LAB — ABO/RH: ABO/RH(D): A POS

## 2021-07-02 LAB — PREGNANCY, URINE: Preg Test, Ur: NEGATIVE

## 2021-07-02 SURGERY — ARTHROPLASTY, HIP, TOTAL,POSTERIOR APPROACH
Anesthesia: Spinal | Site: Hip | Laterality: Left

## 2021-07-02 MED ORDER — METHOCARBAMOL 500 MG PO TABS: 500.0000 mg | ORAL_TABLET | Freq: Four times a day (QID) | ORAL | Status: DC | PRN

## 2021-07-02 MED ORDER — METOCLOPRAMIDE HCL 5 MG PO TABS: 5.0000 mg | ORAL_TABLET | Freq: Three times a day (TID) | ORAL | Status: DC | PRN

## 2021-07-02 MED ORDER — BUPIVACAINE-EPINEPHRINE (PF) 0.25% -1:200000 IJ SOLN
INTRAMUSCULAR | Status: DC | PRN
  Administered 2021-07-02: 30 mL

## 2021-07-02 MED ORDER — ACETAMINOPHEN 10 MG/ML IV SOLN
1000.0000 mg | Freq: Four times a day (QID) | INTRAVENOUS | Status: DC
  Administered 2021-07-02: 1000 mg via INTRAVENOUS
  Filled 2021-07-02: qty 100

## 2021-07-02 MED ORDER — 0.9 % SODIUM CHLORIDE (POUR BTL) OPTIME
TOPICAL | Status: DC | PRN
  Administered 2021-07-02: 1000 mL

## 2021-07-02 MED ORDER — ACETAMINOPHEN 325 MG PO TABS: 325.0000 mg | ORAL_TABLET | Freq: Four times a day (QID) | ORAL | Status: DC | PRN

## 2021-07-02 MED ORDER — METHOCARBAMOL 500 MG IVPB - SIMPLE MED
INTRAVENOUS | Status: AC
  Filled 2021-07-02: qty 50

## 2021-07-02 MED ORDER — PROPOFOL 500 MG/50ML IV EMUL
INTRAVENOUS | Status: DC | PRN
  Administered 2021-07-02: 25 ug/kg/min via INTRAVENOUS

## 2021-07-02 MED ORDER — FENTANYL CITRATE PF 50 MCG/ML IJ SOSY
PREFILLED_SYRINGE | INTRAMUSCULAR | Status: AC
  Filled 2021-07-02: qty 3

## 2021-07-02 MED ORDER — PROPOFOL 10 MG/ML IV BOLUS
INTRAVENOUS | Status: AC
  Filled 2021-07-02: qty 20

## 2021-07-02 MED ORDER — ASPIRIN EC 325 MG PO TBEC
325.0000 mg | DELAYED_RELEASE_TABLET | Freq: Two times a day (BID) | ORAL | Status: DC
  Administered 2021-07-03 – 2021-07-07 (×9): 325 mg via ORAL
  Filled 2021-07-02 (×9): qty 1

## 2021-07-02 MED ORDER — PROPOFOL 500 MG/50ML IV EMUL
INTRAVENOUS | Status: AC
  Filled 2021-07-02: qty 50

## 2021-07-02 MED ORDER — MENTHOL 3 MG MT LOZG: 1.0000 | LOZENGE | OROMUCOSAL | Status: DC | PRN

## 2021-07-02 MED ORDER — TRAMADOL HCL 50 MG PO TABS
50.0000 mg | ORAL_TABLET | Freq: Four times a day (QID) | ORAL | Status: DC | PRN
  Administered 2021-07-02 (×2): 100 mg via ORAL
  Administered 2021-07-03: 50 mg via ORAL
  Administered 2021-07-03 – 2021-07-06 (×12): 100 mg via ORAL
  Administered 2021-07-06: 50 mg via ORAL
  Administered 2021-07-07 (×2): 100 mg via ORAL
  Filled 2021-07-02: qty 2
  Filled 2021-07-02: qty 1
  Filled 2021-07-02 (×8): qty 2
  Filled 2021-07-02 (×2): qty 1
  Filled 2021-07-02 (×6): qty 2
  Filled 2021-07-02: qty 1
  Filled 2021-07-02: qty 2

## 2021-07-02 MED ORDER — PROPOFOL 1000 MG/100ML IV EMUL
INTRAVENOUS | Status: AC
  Filled 2021-07-02: qty 100

## 2021-07-02 MED ORDER — ONDANSETRON HCL 4 MG PO TABS: 4.0000 mg | ORAL_TABLET | Freq: Four times a day (QID) | ORAL | Status: DC | PRN

## 2021-07-02 MED ORDER — KETOROLAC TROMETHAMINE 15 MG/ML IJ SOLN
15.0000 mg | Freq: Once | INTRAMUSCULAR | Status: AC
  Administered 2021-07-02: 15 mg via INTRAVENOUS
  Filled 2021-07-02: qty 1

## 2021-07-02 MED ORDER — BUPIVACAINE HCL (PF) 0.5 % IJ SOLN
INTRAMUSCULAR | Status: DC | PRN
  Administered 2021-07-02: 2.8 mL via INTRATHECAL

## 2021-07-02 MED ORDER — BUPIVACAINE HCL (PF) 0.5 % IJ SOLN
INTRAMUSCULAR | Status: AC
  Filled 2021-07-02: qty 30

## 2021-07-02 MED ORDER — ALBUMIN HUMAN 5 % IV SOLN
INTRAVENOUS | Status: AC
  Filled 2021-07-02: qty 250

## 2021-07-02 MED ORDER — METHOCARBAMOL 500 MG IVPB - SIMPLE MED
500.0000 mg | Freq: Four times a day (QID) | INTRAVENOUS | Status: DC | PRN
  Administered 2021-07-02: 500 mg via INTRAVENOUS
  Filled 2021-07-02: qty 50

## 2021-07-02 MED ORDER — PROMETHAZINE HCL 25 MG/ML IJ SOLN: 6.2500 mg | INTRAMUSCULAR | Status: DC | PRN

## 2021-07-02 MED ORDER — ONDANSETRON HCL 4 MG/2ML IJ SOLN: 4.0000 mg | Freq: Four times a day (QID) | INTRAMUSCULAR | Status: DC | PRN

## 2021-07-02 MED ORDER — BUPIVACAINE-EPINEPHRINE (PF) 0.25% -1:200000 IJ SOLN
INTRAMUSCULAR | Status: AC
  Filled 2021-07-02: qty 30

## 2021-07-02 MED ORDER — POLYETHYLENE GLYCOL 3350 17 G PO PACK: 17.0000 g | PACK | Freq: Every day | ORAL | Status: DC | PRN

## 2021-07-02 MED ORDER — CEFAZOLIN SODIUM-DEXTROSE 2-4 GM/100ML-% IV SOLN
2.0000 g | Freq: Four times a day (QID) | INTRAVENOUS | Status: AC
  Administered 2021-07-02 (×2): 2 g via INTRAVENOUS
  Filled 2021-07-02 (×2): qty 100

## 2021-07-02 MED ORDER — ONDANSETRON HCL 4 MG/2ML IJ SOLN
INTRAMUSCULAR | Status: DC | PRN
  Administered 2021-07-02: 4 mg via INTRAVENOUS

## 2021-07-02 MED ORDER — DEXAMETHASONE SODIUM PHOSPHATE 10 MG/ML IJ SOLN
8.0000 mg | Freq: Once | INTRAMUSCULAR | Status: AC
  Administered 2021-07-02: 8 mg via INTRAVENOUS

## 2021-07-02 MED ORDER — POVIDONE-IODINE 10 % EX SWAB
2.0000 "application " | Freq: Once | CUTANEOUS | Status: AC
  Administered 2021-07-02: 2 via TOPICAL

## 2021-07-02 MED ORDER — SODIUM CHLORIDE 0.9 % IV SOLN: INTRAVENOUS | Status: DC

## 2021-07-02 MED ORDER — ACETAMINOPHEN 500 MG PO TABS
1000.0000 mg | ORAL_TABLET | Freq: Four times a day (QID) | ORAL | Status: DC
  Administered 2021-07-02 – 2021-07-07 (×19): 1000 mg via ORAL
  Filled 2021-07-02 (×19): qty 2

## 2021-07-02 MED ORDER — LACTATED RINGERS IV SOLN: INTRAVENOUS | Status: DC

## 2021-07-02 MED ORDER — PROPOFOL 10 MG/ML IV BOLUS
INTRAVENOUS | Status: DC | PRN
  Administered 2021-07-02: 30 mg via INTRAVENOUS

## 2021-07-02 MED ORDER — CEFAZOLIN SODIUM-DEXTROSE 2-4 GM/100ML-% IV SOLN: 2.0000 g | Freq: Four times a day (QID) | INTRAVENOUS | Status: DC

## 2021-07-02 MED ORDER — ASPIRIN EC 325 MG PO TBEC: 325.0000 mg | DELAYED_RELEASE_TABLET | Freq: Two times a day (BID) | ORAL | Status: DC

## 2021-07-02 MED ORDER — FENTANYL CITRATE PF 50 MCG/ML IJ SOSY
25.0000 ug | PREFILLED_SYRINGE | INTRAMUSCULAR | Status: DC | PRN
  Administered 2021-07-02 (×3): 50 ug via INTRAVENOUS

## 2021-07-02 MED ORDER — METOCLOPRAMIDE HCL 5 MG/ML IJ SOLN: 5.0000 mg | Freq: Three times a day (TID) | INTRAMUSCULAR | Status: DC | PRN

## 2021-07-02 MED ORDER — MIDAZOLAM HCL 2 MG/2ML IJ SOLN
INTRAMUSCULAR | Status: DC | PRN
  Administered 2021-07-02: 2 mg via INTRAVENOUS

## 2021-07-02 MED ORDER — ALBUMIN HUMAN 5 % IV SOLN: INTRAVENOUS | Status: DC | PRN

## 2021-07-02 MED ORDER — BISACODYL 10 MG RE SUPP: 10.0000 mg | Freq: Every day | RECTAL | Status: DC | PRN

## 2021-07-02 MED ORDER — DEXAMETHASONE SODIUM PHOSPHATE 10 MG/ML IJ SOLN
INTRAMUSCULAR | Status: AC
  Filled 2021-07-02: qty 1

## 2021-07-02 MED ORDER — CHLORHEXIDINE GLUCONATE 0.12 % MT SOLN
15.0000 mL | Freq: Once | OROMUCOSAL | Status: AC
  Administered 2021-07-02: 15 mL via OROMUCOSAL

## 2021-07-02 MED ORDER — ORAL CARE MOUTH RINSE: 15.0000 mL | Freq: Once | OROMUCOSAL | Status: AC

## 2021-07-02 MED ORDER — HYDROMORPHONE HCL 2 MG PO TABS
ORAL_TABLET | ORAL | Status: AC
  Filled 2021-07-02: qty 1

## 2021-07-02 MED ORDER — ONDANSETRON HCL 4 MG/2ML IJ SOLN
INTRAMUSCULAR | Status: AC
  Filled 2021-07-02: qty 2

## 2021-07-02 MED ORDER — DEXTROSE 5 % IV SOLN: 500.0000 mg | Freq: Four times a day (QID) | INTRAVENOUS | Status: DC | PRN

## 2021-07-02 MED ORDER — HYDROCHLOROTHIAZIDE 12.5 MG PO CAPS
12.5000 mg | ORAL_CAPSULE | Freq: Every day | ORAL | Status: DC
  Administered 2021-07-04 – 2021-07-07 (×4): 12.5 mg via ORAL
  Filled 2021-07-02 (×5): qty 1

## 2021-07-02 MED ORDER — TRAMADOL HCL 50 MG PO TABS: 50.0000 mg | ORAL_TABLET | Freq: Four times a day (QID) | ORAL | Status: DC | PRN

## 2021-07-02 MED ORDER — DEXAMETHASONE SODIUM PHOSPHATE 10 MG/ML IJ SOLN: 10.0000 mg | Freq: Once | INTRAMUSCULAR | Status: DC

## 2021-07-02 MED ORDER — DOCUSATE SODIUM 100 MG PO CAPS: 100.0000 mg | ORAL_CAPSULE | Freq: Two times a day (BID) | ORAL | Status: DC

## 2021-07-02 MED ORDER — TRANEXAMIC ACID-NACL 1000-0.7 MG/100ML-% IV SOLN
1000.0000 mg | INTRAVENOUS | Status: AC
  Administered 2021-07-02: 1000 mg via INTRAVENOUS
  Filled 2021-07-02: qty 100

## 2021-07-02 MED ORDER — PHENOL 1.4 % MT LIQD: 1.0000 | OROMUCOSAL | Status: DC | PRN

## 2021-07-02 MED ORDER — HYDROMORPHONE HCL 2 MG PO TABS
2.0000 mg | ORAL_TABLET | Freq: Four times a day (QID) | ORAL | Status: DC | PRN
  Administered 2021-07-02: 2 mg via ORAL

## 2021-07-02 MED ORDER — HYDROMORPHONE HCL 2 MG PO TABS
2.0000 mg | ORAL_TABLET | Freq: Four times a day (QID) | ORAL | Status: DC | PRN
  Administered 2021-07-02: 2 mg via ORAL
  Filled 2021-07-02: qty 1

## 2021-07-02 MED ORDER — DEXAMETHASONE SODIUM PHOSPHATE 10 MG/ML IJ SOLN
10.0000 mg | Freq: Once | INTRAMUSCULAR | Status: AC
  Administered 2021-07-03: 10 mg via INTRAVENOUS
  Filled 2021-07-02: qty 1

## 2021-07-02 MED ORDER — LIDOCAINE 2% (20 MG/ML) 5 ML SYRINGE
INTRAMUSCULAR | Status: DC | PRN
  Administered 2021-07-02: 60 mg via INTRAVENOUS

## 2021-07-02 MED ORDER — DOCUSATE SODIUM 100 MG PO CAPS
100.0000 mg | ORAL_CAPSULE | Freq: Two times a day (BID) | ORAL | Status: DC
  Administered 2021-07-02 – 2021-07-07 (×11): 100 mg via ORAL
  Filled 2021-07-02 (×11): qty 1

## 2021-07-02 MED ORDER — HYDROMORPHONE HCL 1 MG/ML IJ SOLN
0.5000 mg | INTRAMUSCULAR | Status: DC | PRN
  Administered 2021-07-02 – 2021-07-03 (×2): 1 mg via INTRAVENOUS
  Filled 2021-07-02 (×2): qty 1

## 2021-07-02 MED ORDER — CEFAZOLIN SODIUM-DEXTROSE 2-4 GM/100ML-% IV SOLN
2.0000 g | INTRAVENOUS | Status: AC
  Administered 2021-07-02: 2 g via INTRAVENOUS
  Filled 2021-07-02: qty 100

## 2021-07-02 MED ORDER — MIDAZOLAM HCL 2 MG/2ML IJ SOLN
INTRAMUSCULAR | Status: AC
  Filled 2021-07-02: qty 2

## 2021-07-02 MED ORDER — METHOCARBAMOL 500 MG PO TABS
500.0000 mg | ORAL_TABLET | Freq: Four times a day (QID) | ORAL | Status: DC | PRN
  Administered 2021-07-02 – 2021-07-07 (×18): 500 mg via ORAL
  Filled 2021-07-02 (×18): qty 1

## 2021-07-02 MED ORDER — STERILE WATER FOR IRRIGATION IR SOLN
Status: DC | PRN
  Administered 2021-07-02: 2000 mL

## 2021-07-02 SURGICAL SUPPLY — 64 items
BAG COUNTER SPONGE SURGICOUNT (BAG) IMPLANT
BAG DECANTER FOR FLEXI CONT (MISCELLANEOUS) ×2 IMPLANT
BAG ZIPLOCK 12X15 (MISCELLANEOUS) IMPLANT
BIT DRILL 2.8X128 (BIT) ×2 IMPLANT
BLADE EXTENDED COATED 6.5IN (ELECTRODE) ×2 IMPLANT
BLADE SAG 18X100X1.27 (BLADE) ×2 IMPLANT
BLADE SAGITTAL 25.0X1.19X90 (BLADE) IMPLANT
BLADE SAW SAG 73X25 THK (BLADE) ×1
BLADE SAW SGTL 73X25 THK (BLADE) ×1 IMPLANT
BLADE SURG SZ10 CARB STEEL (BLADE) ×4 IMPLANT
CLSR STERI-STRIP ANTIMIC 1/2X4 (GAUZE/BANDAGES/DRESSINGS) ×2 IMPLANT
COVER SURGICAL LIGHT HANDLE (MISCELLANEOUS) ×2 IMPLANT
CUP ACET PINNACLE SECTR 50MM (Hips) ×1 IMPLANT
DECANTER SPIKE VIAL GLASS SM (MISCELLANEOUS) ×2 IMPLANT
DRAPE INCISE IOBAN 66X45 STRL (DRAPES) ×2 IMPLANT
DRAPE ORTHO SPLIT 77X108 STRL (DRAPES) ×4
DRAPE POUCH INSTRU U-SHP 10X18 (DRAPES) ×2 IMPLANT
DRAPE SURG ORHT 6 SPLT 77X108 (DRAPES) ×2 IMPLANT
DRAPE U-SHAPE 47X51 STRL (DRAPES) ×2 IMPLANT
DRESSING MEPILEX FLEX 4X4 (GAUZE/BANDAGES/DRESSINGS) IMPLANT
DRSG AQUACEL AG ADV 3.5X10 (GAUZE/BANDAGES/DRESSINGS) ×2 IMPLANT
DRSG MEPILEX FLEX 4X4 (GAUZE/BANDAGES/DRESSINGS)
DURAPREP 26ML APPLICATOR (WOUND CARE) ×2 IMPLANT
ELECT REM PT RETURN 15FT ADLT (MISCELLANEOUS) ×2 IMPLANT
EVACUATOR 1/8 PVC DRAIN (DRAIN) IMPLANT
FACESHIELD WRAPAROUND (MASK) ×8 IMPLANT
GAUZE SPONGE 4X4 12PLY STRL (GAUZE/BANDAGES/DRESSINGS) ×2 IMPLANT
GLOVE SRG 8 PF TXTR STRL LF DI (GLOVE) ×1 IMPLANT
GLOVE SURG ENC MOIS LTX SZ6.5 (GLOVE) ×4 IMPLANT
GLOVE SURG ENC MOIS LTX SZ8 (GLOVE) ×4 IMPLANT
GLOVE SURG UNDER POLY LF SZ7 (GLOVE) ×6 IMPLANT
GLOVE SURG UNDER POLY LF SZ8 (GLOVE) ×2
GOWN STRL REUS W/TWL LRG LVL3 (GOWN DISPOSABLE) ×4 IMPLANT
HEAD CER SROM 32MM PLUS (Hips) ×2 IMPLANT
IMMOBILIZER KNEE 20 (SOFTGOODS) ×2
IMMOBILIZER KNEE 20 THIGH 36 (SOFTGOODS) ×1 IMPLANT
KIT BASIN OR (CUSTOM PROCEDURE TRAY) ×2 IMPLANT
KIT TURNOVER KIT A (KITS) ×2 IMPLANT
LINER MARATHON 32 50 (Hips) ×2 IMPLANT
MANIFOLD NEPTUNE II (INSTRUMENTS) ×2 IMPLANT
NDL SAFETY ECLIPSE 18X1.5 (NEEDLE) ×2 IMPLANT
NEEDLE HYPO 18GX1.5 SHARP (NEEDLE) ×4
NS IRRIG 1000ML POUR BTL (IV SOLUTION) ×2 IMPLANT
PACK TOTAL JOINT (CUSTOM PROCEDURE TRAY) ×2 IMPLANT
PADDING CAST COTTON 6X4 STRL (CAST SUPPLIES) ×2 IMPLANT
PASSER SUT SWANSON 36MM LOOP (INSTRUMENTS) ×2 IMPLANT
PENCIL SMOKE EVACUATOR (MISCELLANEOUS) IMPLANT
PINNACLE SECTOR CUP 50MM (Hips) ×2 IMPLANT
PROTECTOR NERVE ULNAR (MISCELLANEOUS) ×2 IMPLANT
SLEEVE PROX POROUS COATED 16F (Hips) ×2 IMPLANT
STEM FEM MOD STD 36 11X16X150 (Hips) ×2 IMPLANT
STRIP CLOSURE SKIN 1/2X4 (GAUZE/BANDAGES/DRESSINGS) ×4 IMPLANT
SUT ETHIBOND NAB CT1 #1 30IN (SUTURE) ×4 IMPLANT
SUT MNCRL AB 4-0 PS2 18 (SUTURE) ×2 IMPLANT
SUT STRATAFIX 0 PDS 27 VIOLET (SUTURE) ×4
SUT VIC AB 2-0 CT1 27 (SUTURE) ×6
SUT VIC AB 2-0 CT1 TAPERPNT 27 (SUTURE) ×3 IMPLANT
SUTURE STRATFX 0 PDS 27 VIOLET (SUTURE) ×2 IMPLANT
SYR 20ML LL LF (SYRINGE) ×2 IMPLANT
SYR 50ML LL SCALE MARK (SYRINGE) IMPLANT
TOWEL OR 17X26 10 PK STRL BLUE (TOWEL DISPOSABLE) ×4 IMPLANT
TOWEL OR NON WOVEN STRL DISP B (DISPOSABLE) ×2 IMPLANT
TRAY FOLEY MTR SLVR 16FR STAT (SET/KITS/TRAYS/PACK) ×2 IMPLANT
WATER STERILE IRR 1000ML POUR (IV SOLUTION) ×2 IMPLANT

## 2021-07-02 NOTE — Op Note (Signed)
Pre-operative diagnosis- Osteoarthritis Left hip  Post-operative diagnosis- Osteoarthritis  Left hip  Procedure-  LeftTotal Hip Arthroplasty  Surgeon- Dione Plover. Evelyn Cid, MD  Assistant- Theresa Duty, PA-C   Anesthesia  Spinal  EBL- 400 mL   Drain Hemovac   Complication- None  Condition-PACU - hemodynamically stable.   Brief Clinical Note-  Evelyn Butler is a 55 y.o. female with end stage arthritis of her left hip with progressively worsening pain and dysfunction. Pain occurs with activity and rest including pain at night. She has tried analgesics, protected weight bearing and rest without benefit. Pain is too severe to attempt physical therapy. Radiographs demonstrate bone on bone arthritis with subchondral cyst formation. She has a collapsed femoral head and essentially fused hip. She presents now for left THA.   Procedure in detail-   The patient is brought into the operating room and placed on the operating table. After successful administration of Spinal  anesthesia, the patient is placed in the  Right lateral decubitus position with the  Left side up and held in place with the hip positioner. The lower extremity is isolated from the perineum with plastic drapes and time-out is performed by the surgical team. The lower extremity is then prepped and draped in the usual sterile fashion. A short posterolateral incision is made with a ten blade through the subcutaneous tissue to the level of the fascia lata which is incised in line with the skin incision. The sciatic nerve is palpated and protected and the short external rotators and capsule are isolated from the femur. The hip is then dislocated and the center of the femoral head is marked. A trial prosthesis is placed such that the trial head corresponds to the center of the patients' native femoral head. The resection level is marked on the femoral neck and the resection is made with an oscillating saw. The femoral head is  removed and femoral retractors placed to gain access to the femoral canal.      The canal finder is passed into the femoral canal and the canal is thoroughly irrigated with sterile saline to remove the fatty contents. Axial reaming is performed to 11  mm, proximal reaming to 6F  and the sleeve machined to a small. A 16 F small trial sleeve is placed into the proximal femur.      The femur is then retracted anteriorly to gain acetabular exposure. Acetabular retractors are placed and the labrum and osteophytes are removed, Acetabular reaming is performed to 49  mm and a 50  mm Pinnacle acetabular shell is placed in anatomic position with excellent purchase. Additional dome screws were not needed. The permanent 32 mm neutral + 4 Marathon liner is placed into the acetabular shell.      The trial femur is then placed into the femoral canal. The size is 16 x 11  stem with a 36 standard  neck and a 32 + 0 head with the neck version matching  the patients' native anteversion. The hip is reduced with excellent stability with full extension and full external rotation, 70 degrees flexion with 40 degrees adduction and 90 degrees internal rotation and 90 degrees of flexion with 70 degrees of internal rotation. The operative leg is placed on top of the non-operative leg and the leg lengths are found to be equal. The trials are then removed and the permanent implant of the same size is impacted into the femoral canal. The ceramic femoral head of the same size as the trial  is placed and the hip is reduced with the same stability parameters. The operative leg is again placed on top of the non-operative leg and the leg lengths are found to be equal.      The wound is then copiously irrigated with saline solution and the capsule and short external rotators are re-attached to the femur through drill holes with Ethibond suture. The fascia lata is closed with running 0- Stratofix suture. The subcutaneous tissues are closed with #1  and2-0 vicryl and the subcuticular layer closed with running 4-0 Monocryl. The incision cleaned and dried, and steri-srips and a bulky sterile dressing applied. The limb is placed into a knee immobilizer and the patient is awakened and transported to recovery in stable condition.      Please note that a surgical assistant was a medical necessity for this procedure in order to perform it in a safe and expeditious manner. The assistant was necessary to provide retraction to the vital neurovascular structures and to retract and position the limb to allow for anatomic placement of the prosthetic components.  Dione Plover Evelyn Langbehn, MD    07/02/2021, 9:41 AM

## 2021-07-02 NOTE — Anesthesia Postprocedure Evaluation (Signed)
Anesthesia Post Note  Patient: Evelyn Butler [facility]  Procedure(s) Performed: TOTAL HIP ARTHROPLASTY-POSTERIOR (Left: Hip)     Patient location during evaluation: PACU Anesthesia Type: Spinal Level of consciousness: awake and alert Pain management: pain level controlled Vital Signs Assessment: post-procedure vital signs reviewed and stable Respiratory status: spontaneous breathing and respiratory function stable Cardiovascular status: blood pressure returned to baseline and stable Postop Assessment: spinal receding Anesthetic complications: no   No notable events documented.  Last Vitals:  Vitals:   07/02/21 1145 07/02/21 1200  BP: 126/90 123/90  Pulse: 77 79  Resp: 11 11  Temp: (!) 36.1 C (!) 36.3 C  SpO2: 100% 100%    Last Pain:  Vitals:   07/02/21 1145  TempSrc:   PainSc: Asleep                 Tiajuana Amass

## 2021-07-02 NOTE — Evaluation (Signed)
Physical Therapy Evaluation Patient Details Name: Evelyn Butler [facility] MRN: [id number] DOB: [birth year] Today's Date: 07/02/2021   History of Present Illness  Pt is a 55 y.o. female s/p Lt THA posterior approach. PMH significant for HTN and  pre-diabetes.  Clinical Impression  Pt is POD 0 s/p Lt THA posterior approach resulting in the deficits listed below (see PT Problem List). Pt performed supine to sit transfers with MOD A for progression of LEs to EOB and cues for sequencing to assist with maintenance of posterior hip precautions. Pt reported feeling lightheaded stating "I dont feel so good" while sitting EOB. BP found to be 81/44, HR 111bpm. Pt assisted back to supine with MAX A +2 to bring LEs onto bed and for controlled descent of trunk. BP improved to 138/39, HR 104bpm after ~4-83mn in supine. Pt lives with her husband, but has limited physical assist at home as husband has own medical restrictions. Pt is currently NOT at safe mobility level for d/c and will benefit from continued skilled PT to maximize functional mobility to increase independence and assess carryover of posterior hip precautions.      Follow Up Recommendations Follow surgeon's recommendation for DC plan and follow-up therapies    Equipment Recommendations  Rolling walker with 5" wheels    Recommendations for Other Services       Precautions / Restrictions Precautions Precautions: Posterior Hip Precaution Booklet Issued: Yes (comment) Precaution Comments: precautions handout provided and reviewed with pt Restrictions Weight Bearing Restrictions: No Other Position/Activity Restrictions: WBAT      Mobility  Bed Mobility Overal bed mobility: Needs Assistance Bed Mobility: Supine to Sit;Sit to Supine     Supine to sit: Mod assist;HOB elevated Sit to supine: Max assist;+2 for physical assistance;+2 for safety/equipment   General bed mobility comments: Pt required MOD A for progression of B LEs off EOB L>R.  Pt also has history of Rt hip pain. Able to use UEs on bedrail to assist, cues for sequencing to adhere to posteror hip precautions. Pt reported feeling lightheaded stating "I dont feel so good" while sitting EOB. BP found to be 81/44, HR 111bpm. Further mobility deferred and pt assisted back to supine with MAX A +2 to bring LEs onto bed and for controlled descent of trunk. BP improved to 138/39, HR 104bpm after ~4-591m in supine, RN aware.    Transfers                    Ambulation/Gait                Stairs            Wheelchair Mobility    Modified Rankin (Stroke Patients Only)       Balance Overall balance assessment: Needs assistance Sitting-balance support: Single extremity supported;Feet supported Sitting balance-Leahy Scale: Poor Sitting balance - Comments: Pt required use of at least single UE support for seated balance, intermittent CGA from therapist                                     Pertinent Vitals/Pain Pain Assessment: Faces Faces Pain Scale: Hurts even more Pain Location: Lt and Rt hip Pain Descriptors / Indicators: Sore;Tender;Discomfort Pain Intervention(s): Limited activity within patient's tolerance;Monitored during session;Repositioned;Premedicated before session    Home Living Family/patient expects to be discharged to:: Private residence Living Arrangements: Spouse/significant other Available Help at Discharge: Family;Available PRN/intermittently Type of Home:  House Home Access: Stairs to enter Entrance Stairs-Rails: None Entrance Stairs-Number of Steps: 1 Home Layout: Two level;Able to live on main level with bedroom/bathroom;Full bath on main level Home Equipment: Youth worker - 4 wheels Additional Comments: Pt lives with her husband who is unable to physically assist, daughter available PRN works Paediatric nurse.    Prior Function Level of Independence: Independent with assistive device(s)          Comments: rollator for community ambulation and use electronic scooter at work. 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     Hand Dominance   Dominant Hand: Right    Extremity/Trunk Assessment   Upper Extremity Assessment Upper Extremity Assessment: Overall WFL for tasks assessed    Lower Extremity Assessment Lower Extremity Assessment: LLE deficits/detail LLE Deficits / Details: Pt with fair quad set strength bilaterally LLE Sensation: WNL       Communication   Communication: No difficulties  Cognition Arousal/Alertness: Awake/alert Behavior During Therapy: WFL for tasks assessed/performed Overall Cognitive Status: Within Functional Limits for tasks assessed                                        General Comments      Exercises Total Joint Exercises Ankle Circles/Pumps: AROM;Both;20 reps;Supine   Assessment/Plan    PT Assessment Patient needs continued PT services  PT Problem List Decreased strength;Decreased range of motion;Decreased activity tolerance;Decreased balance;Decreased mobility;Pain       PT Treatment Interventions DME instruction;Gait training;Stair training;Functional mobility training;Therapeutic activities;Therapeutic exercise;Balance training;Patient/family education    PT Goals (Current goals can be found in the Care Plan section)  Acute Rehab PT Goals Patient Stated Goal: Be independent and have less pain PT Goal Formulation: With patient Time For Goal Achievement: 07/16/21 Potential to Achieve Goals: Good    Frequency 7X/week   Barriers to discharge Decreased caregiver support      Co-evaluation               AM-PAC PT "6 Clicks" Mobility  Outcome Measure Help needed turning from your back to your side while in a flat bed without using bedrails?: A Lot Help needed moving from lying on your back to sitting on the side of a flat bed without using bedrails?: A Lot Help needed moving  to and from a bed to a chair (including a wheelchair)?: Total Help needed standing up from a chair using your arms (e.g., wheelchair or bedside chair)?: Total Help needed to walk in hospital room?: Total Help needed climbing 3-5 steps with a railing? : Total 6 Click Score: 8    End of Session Equipment Utilized During Treatment: Left knee immobilizer (pt with L KI donned upon entry while in supine) Activity Tolerance: Treatment limited secondary to medical complications (Comment) Patient left: in bed;with call bell/phone within reach;with bed alarm set Nurse Communication: Mobility status (Pt BP during session) PT Visit Diagnosis: Muscle weakness (generalized) (M62.81);Other abnormalities of gait and mobility (R26.89);Pain Pain - Right/Left: Left Pain - part of body: Hip    Time: [id number] PT Time Calculation (min) (ACUTE ONLY): 33 min   Charges:   PT Evaluation $PT Eval Low Complexity: 1 Low PT Treatments $Therapeutic Activity: 8-22 mins        Festus Barren PT, DPT  Acute Rehabilitation Services  Office [phone]   07/02/2021, 5:51 PM

## 2021-07-02 NOTE — Anesthesia Procedure Notes (Signed)
Procedure Name: MAC Date/Time: 07/02/2021 7:20 AM Performed by: Niel Hummer, CRNA Pre-anesthesia Checklist: Patient identified, Emergency Drugs available, Suction available and Patient being monitored Oxygen Delivery Method: Simple face mask

## 2021-07-02 NOTE — Discharge Instructions (Addendum)
 Dr. Frank Aluisio Total Joint Specialist [facility] [address] [place],  [address] [phone]  POSTERIOR TOTAL HIP REPLACEMENT POSTOPERATIVE DIRECTIONS  Hip Rehabilitation, Guidelines Following Surgery  The results of a hip operation are greatly improved after range of motion and muscle strengthening exercises. Follow all safety measures which are given to protect your hip. If any of these exercises cause increased pain or swelling in your joint, decrease the amount until you are comfortable again. Then slowly increase the exercises. Call your caregiver if you have problems or questions.   BLOOD CLOT PREVENTION Take a 325 mg Aspirin two times a day for three weeks following surgery. Then take an 81 mg Aspirin once a day for three weeks. Then discontinue Aspirin. You may resume your vitamins/supplements upon discharge from the hospital. Do not take any NSAIDs (Advil, Aleve, Ibuprofen, Meloxicam, etc.) until you have discontinued the 325 mg Aspirin.  PRECAUTIONS (6 WEEKS FOLLOWING SURGERY) Do not bend your hip past a 90 degree angle Do not cross your legs. Don't twist your hip inwards- keep knees and toes pointed upwards   HOME CARE INSTRUCTIONS  Remove items at home which could result in a fall. This includes throw rugs or furniture in walking pathways.  ICE to the affected hip every three hours for 30 minutes at a time and then as needed for pain and swelling.  Continue to use ice on the hip for pain and swelling from surgery. You may notice swelling that will progress down to the foot and ankle.  This is normal after surgery.  Elevate the leg when you are not up walking on it.   Continue to use the breathing machine which will help keep your temperature down.  It is common for your temperature to cycle up and down following surgery, especially at night when you are not up moving around and exerting yourself.  The breathing machine keeps your lungs expanded  and your temperature down.  DIET You may resume your previous home diet once your are discharged from the hospital.  DRESSING / WOUND CARE / SHOWERING Keep the surgical dressing until follow up.  The dressing is water proof, so you can shower without any extra covering.  IF THE DRESSING FALLS OFF or the wound gets wet inside, change the dressing with sterile gauze.  Please use good hand washing techniques before changing the dressing.  Do not use any lotions or creams on the incision until instructed by your surgeon.   You may start showering once you are discharged home but do not submerge the incision under water. Just pat the incision dry and apply a dry gauze dressing on daily. Change the surgical dressing daily and reapply a dry dressing each time.  ACTIVITY Walk with your walker as instructed. Use walker as long as suggested by your caregivers. Avoid periods of inactivity such as sitting longer than an hour when not asleep. This helps prevent blood clots.  You may resume a sexual relationship in one month or when given the OK by your doctor.  You may return to work once you are cleared by your doctor.  Do not drive a car for 6 weeks or until released by you surgeon.  Do not drive while taking narcotics.  WEIGHT BEARING Weight bearing as tolerated with assist device (walker, cane, etc) as directed, use it as long as suggested by your surgeon or therapist, typically at least 4-6 weeks.  POSTOPERATIVE CONSTIPATION PROTOCOL Constipation - defined medically as   fewer than three stools per week and severe constipation as less than one stool per week.  One of the most common issues patients have following surgery is constipation.  Even if you have a regular bowel pattern at home, your normal regimen is likely to be disrupted due to multiple reasons following surgery.  Combination of anesthesia, postoperative narcotics, change in appetite and fluid intake all can affect your bowels.  In order  to avoid complications following surgery, here are some recommendations in order to help you during your recovery period.  Colace (docusate) - Pick up an over-the-counter form of Colace or another stool softener and take twice a day as long as you are requiring postoperative pain medications.  Take with a full glass of water daily.  If you experience loose stools or diarrhea, hold the colace until you stool forms back up.  If your symptoms do not get better within 1 week or if they get worse, check with your doctor.  Dulcolax (bisacodyl) - Pick up over-the-counter and take as directed by the product packaging as needed to assist with the movement of your bowels.  Take with a full glass of water.  Use this product as needed if not relieved by Colace only.   MiraLax (polyethylene glycol) - Pick up over-the-counter to have on hand.  MiraLax is a solution that will increase the amount of water in your bowels to assist with bowel movements.  Take as directed and can mix with a glass of water, juice, soda, coffee, or tea.  Take if you go more than two days without a movement. Do not use MiraLax more than once per day. Call your doctor if you are still constipated or irregular after using this medication for 7 days in a row.  If you continue to have problems with postoperative constipation, please contact the office for further assistance and recommendations.  If you experience "the worst abdominal pain ever" or develop nausea or vomiting, please contact the office immediatly for further recommendations for treatment.  ITCHING  If you experience itching with your medications, try taking only a single pain pill, or even half a pain pill at a time.  You can also use Benadryl over the counter for itching or also to help with sleep.   TED HOSE STOCKINGS Wear the elastic stockings on both legs for three weeks following surgery during the day but you may remove then at night for sleeping.  MEDICATIONS See your  medication summary on the "After Visit Summary" that the nursing staff will review with you prior to discharge.  You may have some home medications which will be placed on hold until you complete the course of blood thinner medication.  It is important for you to complete the blood thinner medication as prescribed by your surgeon.  Continue your approved medications as instructed at time of discharge.  PRECAUTIONS If you experience chest pain or shortness of breath - call 911 immediately for transfer to the hospital emergency department.  If you develop a fever greater that 101 F, purulent drainage from wound, increased redness or drainage from wound, foul odor from the wound/dressing, or calf pain - CONTACT YOUR SURGEON.                                                   FOLLOW-UP APPOINTMENTS Make sure   you keep all of your appointments after your operation with your surgeon and caregivers. You should call the office at the above phone number and make an appointment for approximately two weeks after the date of your surgery or on the date instructed by your surgeon outlined in the "After Visit Summary".  RANGE OF MOTION AND STRENGTHENING EXERCISES  These exercises are designed to help you keep full movement of your hip joint. Follow your caregiver's or physical therapist's instructions. Perform all exercises about fifteen times, three times per day or as directed. Exercise both hips, even if you have had only one joint replacement. These exercises can be done on a training (exercise) mat, on the floor, on a table or on a bed. Use whatever works the best and is most comfortable for you. Use music or television while you are exercising so that the exercises are a pleasant break in your day. This will make your life better with the exercises acting as a break in routine you can look forward to.  Lying on your back, slowly slide your foot toward your buttocks, raising your knee up off the floor. Then slowly  slide your foot back down until your leg is straight again.  Lying on your back spread your legs as far apart as you can without causing discomfort.  Lying on your side, raise your upper leg and foot straight up from the floor as far as is comfortable. Slowly lower the leg and repeat.  Lying on your back, tighten up the muscle in the front of your thigh (quadriceps muscles). You can do this by keeping your leg straight and trying to raise your heel off the floor. This helps strengthen the largest muscle supporting your knee.  Lying on your back, tighten up the muscles of your buttocks both with the legs straight and with the knee bent at a comfortable angle while keeping your heel on the floor.   IF YOU ARE TRANSFERRED TO A SKILLED REHAB FACILITY If the patient is transferred to a skilled rehab facility following release from the hospital, a list of the current medications will be sent to the facility for the patient to continue.  When discharged from the skilled rehab facility, please have the facility set up the patient's Home Health Physical Therapy prior to being released. Also, the skilled facility will be responsible for providing the patient with their medications at time of release from the facility to include their pain medication, the muscle relaxants, and their blood thinner medication. If the patient is still at the rehab facility at time of the two week follow up appointment, the skilled rehab facility will also need to assist the patient in arranging follow up appointment in our office and any transportation needs.  MAKE SURE YOU:  Understand these instructions.  Get help right away if you are not doing well or get worse.   Pick up stool softner and laxative for home use following surgery while on pain medications. Do not submerge incision under water. Please use good hand washing techniques while changing dressing each day. May shower starting three days after surgery. Please use a  clean towel to pat the incision dry following showers. Continue to use ice for pain and swelling after surgery. Do not use any lotions or creams on the incision until instructed by your surgeon. 

## 2021-07-02 NOTE — Anesthesia Procedure Notes (Signed)
Spinal  Patient location during procedure: OR Start time: 07/02/2021 7:21 AM End time: 07/02/2021 7:26 AM Reason for block: surgical anesthesia Staffing Performed: resident/CRNA  Resident/CRNA: Niel Hummer, CRNA Preanesthetic Checklist Completed: patient identified, IV checked, risks and benefits discussed, surgical consent, monitors and equipment checked, pre-op evaluation and timeout performed Spinal Block Patient position: sitting Prep: DuraPrep Patient monitoring: heart rate, continuous pulse ox and blood pressure Approach: midline Location: L3-4 Injection technique: single-shot Needle Needle type: Pencan  Needle gauge: 24 G Needle length: 10 cm Additional Notes +csf flow, - heme. No complications noted

## 2021-07-02 NOTE — Transfer of Care (Signed)
Immediate Anesthesia Transfer of Care Note  Patient: Evelyn Butler [facility]  Procedure(s) Performed: TOTAL HIP ARTHROPLASTY-POSTERIOR (Left: Hip)  Patient Location: PACU  Anesthesia Type:Spinal  Level of Consciousness: awake  Airway & Oxygen Therapy: Patient Spontanous Breathing and Patient connected to face mask oxygen  Post-op Assessment: Report given to RN and Post -op Vital signs reviewed and stable  Post vital signs: Reviewed and stable  Last Vitals:  Vitals Value Taken Time  BP 105/77 07/02/21 1005  Temp    Pulse 85   Resp 15 07/02/21 1006  SpO2 100   Vitals shown include unvalidated device data.  Last Pain:  Vitals:   07/02/21 0634  TempSrc: Oral  PainSc:       Patients Stated Pain Goal: 6 ([id number])  Complications: No notable events documented.

## 2021-07-02 NOTE — Interval H&P Note (Signed)
History and Physical Interval Note:  07/02/2021 6:28 AM  Merrill  has presented today for surgery, with the diagnosis of left hip osteoarthritis.  The various methods of treatment have been discussed with the patient and family. After consideration of risks, benefits and other options for treatment, the patient has consented to  Procedure(s): TOTAL HIP ARTHROPLASTY-POSTERIOR (Left) as a surgical intervention.  The patient's history has been reviewed, patient examined, no change in status, stable for surgery.  I have reviewed the patient's chart and labs.  Questions were answered to the patient's satisfaction.     Pilar Plate Hiep Ollis

## 2021-07-03 LAB — CBC
HCT: 23.5 % — ABNORMAL LOW (ref 36.0–46.0)
HCT: 22.2 % — ABNORMAL LOW (ref 36.0–46.0)
Hemoglobin: 8.1 g/dL — ABNORMAL LOW (ref 12.0–15.0)
Hemoglobin: 7.6 g/dL — ABNORMAL LOW (ref 12.0–15.0)
MCH: 29.6 pg (ref 26.0–34.0)
MCH: 29.8 pg (ref 26.0–34.0)
MCHC: 34.5 g/dL (ref 30.0–36.0)
MCHC: 34.2 g/dL (ref 30.0–36.0)
MCV: 85.8 fL (ref 80.0–100.0)
MCV: 87.1 fL (ref 80.0–100.0)
Platelets: 224 10*3/uL (ref 150–400)
Platelets: 194 10*3/uL (ref 150–400)
RBC: 2.74 MIL/uL — ABNORMAL LOW (ref 3.87–5.11)
RBC: 2.55 MIL/uL — ABNORMAL LOW (ref 3.87–5.11)
RDW: 13.4 % (ref 11.5–15.5)
RDW: 13.5 % (ref 11.5–15.5)
WBC: 8.1 10*3/uL (ref 4.0–10.5)
WBC: 9.9 10*3/uL (ref 4.0–10.5)
nRBC: 0 % (ref 0.0–0.2)
nRBC: 0 % (ref 0.0–0.2)

## 2021-07-03 LAB — BASIC METABOLIC PANEL
Anion gap: 5 (ref 5–15)
BUN: 18 mg/dL (ref 6–20)
CO2: 23 mmol/L (ref 22–32)
Calcium: 8.6 mg/dL — ABNORMAL LOW (ref 8.9–10.3)
Chloride: 110 mmol/L (ref 98–111)
Creatinine, Ser: 0.88 mg/dL (ref 0.44–1.00)
GFR, Estimated: >60 mL/min (ref >60)
Glucose, Bld: 133 mg/dL — ABNORMAL HIGH (ref 70–99)
Potassium: 4.1 mmol/L (ref 3.5–5.1)
Sodium: 138 mmol/L (ref 135–145)

## 2021-07-03 MED ORDER — SODIUM CHLORIDE 0.9 % IV BOLUS
250.0000 mL | Freq: Once | INTRAVENOUS | Status: AC
  Administered 2021-07-03: 250 mL via INTRAVENOUS

## 2021-07-03 MED ORDER — SODIUM CHLORIDE 0.9 % IV BOLUS
500.0000 mL | Freq: Once | INTRAVENOUS | Status: AC
  Administered 2021-07-03: 500 mL via INTRAVENOUS

## 2021-07-03 NOTE — Plan of Care (Signed)
Plan of care reviewed and discussed with the patient. 

## 2021-07-03 NOTE — Progress Notes (Signed)
Physical Therapy Treatment Patient Details Name: Evelyn Butler [facility] MRN: [id number] DOB: Sep 29, 1966 Today's Date: 07/03/2021    History of Present Illness Pt is a 55 y.o. female s/p Lt THA posterior approach. PMH significant for HTN and  pre-diabetes.    PT Comments    Pt very cooperative and progressing with mobility with no c/o dizziness this date but requires increased time for all tasks and fatigues easily.  This am, pt performed therex program and ambulated limited distance in room.  Pt states spouse is very limited in ability to assist 2* disability.   Follow Up Recommendations  Follow surgeon's recommendation for DC plan and follow-up therapies     Equipment Recommendations  Rolling walker with 5" wheels    Recommendations for Other Services       Precautions / Restrictions Precautions Precautions: Posterior Hip Precaution Booklet Issued: Yes (comment) Precaution Comments: Pt recalls 2/3 THP without cues.  All precautions reviewed Restrictions Weight Bearing Restrictions: No RLE Weight Bearing: Weight bearing as tolerated    Mobility  Bed Mobility Overal bed mobility: Needs Assistance Bed Mobility: Supine to Sit     Supine to sit: Mod assist;HOB elevated     General bed mobility comments: Increased time with cues for sequence; assist to manage LEs and to rotate to EOB sitting with bed pad    Transfers Overall transfer level: Needs assistance Equipment used: Rolling walker (2 wheeled) Transfers: Sit to/from Stand Sit to Stand: Min assist;Mod assist         General transfer comment: cues for LE management and use of UEs to self assist.  Physical assist to bring wt up and fwd and to balance in initial standing.  Ambulation/Gait Ambulation/Gait assistance: Min assist;Mod assist Gait Distance (Feet): 12 Feet Assistive device: Rolling walker (2 wheeled) Gait Pattern/deviations: Step-to pattern;Decreased step length - right;Decreased step length -  left;Shuffle;Trunk flexed Gait velocity: decr   General Gait Details: Increased time with cues for sequence, posture and position from [employer]             Wheelchair Mobility    Modified Rankin (Stroke Patients Only)       Balance Overall balance assessment: Needs assistance Sitting-balance support: No upper extremity supported;Feet supported Sitting balance-Leahy Scale: Good     Standing balance support: Bilateral upper extremity supported Standing balance-Leahy Scale: Poor                              Cognition Arousal/Alertness: Awake/alert Behavior During Therapy: WFL for tasks assessed/performed Overall Cognitive Status: Within Functional Limits for tasks assessed                                        Exercises Total Joint Exercises Ankle Circles/Pumps: AROM;Both;20 reps;Supine Quad Sets: AROM;Both;10 reps;Supine Heel Slides: AAROM;Left;20 reps;Supine Hip ABduction/ADduction: AAROM;Left;15 reps;Supine    General Comments        Pertinent Vitals/Pain Pain Assessment: Faces Faces Pain Scale: Hurts little more Pain Location: Lt and Rt hip Pain Descriptors / Indicators: Sore;Tender;Discomfort Pain Intervention(s): Limited activity within patient's tolerance;Monitored during session;Premedicated before session    Home Living                      Prior Function            PT Goals (current goals can  now be found in the care plan section) Acute Rehab PT Goals Patient Stated Goal: Be independent and have less pain PT Goal Formulation: With patient Time For Goal Achievement: 07/16/21 Potential to Achieve Goals: Good Progress towards PT goals: Progressing toward goals    Frequency    7X/week      PT Plan Current plan remains appropriate    Co-evaluation              AM-PAC PT "6 Clicks" Mobility   Outcome Measure  Help needed turning from your back to your side while in a flat bed without  using bedrails?: A Lot Help needed moving from lying on your back to sitting on the side of a flat bed without using bedrails?: A Lot Help needed moving to and from a bed to a chair (including a wheelchair)?: A Lot Help needed standing up from a chair using your arms (e.g., wheelchair or bedside chair)?: A Lot Help needed to walk in hospital room?: A Lot Help needed climbing 3-5 steps with a railing? : Total 6 Click Score: 11    End of Session Equipment Utilized During Treatment: Gait belt Activity Tolerance: Patient limited by fatigue Patient left: in chair;with call bell/phone within reach;with chair alarm set;with family/visitor present;with nursing/sitter in room Nurse Communication: Mobility status PT Visit Diagnosis: Muscle weakness (generalized) (M62.81);Pain;Difficulty in walking, not elsewhere classified (R26.2) Pain - Right/Left: Left Pain - part of body: Hip     Time: [id number] PT Time Calculation (min) (ACUTE ONLY): 35 min  Charges:  $Gait Training: 8-22 mins $Therapeutic Exercise: 8-22 mins                     [place] Pager [phone] Office [phone]    Sherill Wegener 07/03/2021, 1:48 PM

## 2021-07-03 NOTE — TOC Initial Note (Signed)
Transition of Care [facility]) - Initial/Assessment Note   Patient Details  Name: Evelyn Butler [facility] MRN: [id number] Date of Birth: [birth year]  Transition of [facility]) CM/SW Contact:    Sherie Don, LCSW Phone Number: 07/03/2021, 1:00 PM  Clinical Narrative: Patient is a 55 year old female who is under observation for total left hip arthroplasty. Per review of H&P, patient has declined SNF and the plan was for discharge home with HHPT. Patient has limited support at home.  CSW met with patient to review discharge plan and needs. Patient reported she does not have enough support to safely discharge home and requested CIR. Per chart review, patient likely does not meet criteria and her insurance is unlikely to approve. CSW explained that patient's options are likely to go to SNF if there is not adequate help/supervision at home or to discharge home with HHPT. However, [facility] will not provide several hours of services per day, so the patient can consider private paying for personal care services which are not covered by insurance. Patient asked who would set up PCS. CSW explained that TOC does not set up PCS, but can set up [facility] which will take time as it was not prearranged. Patient reported she wanted to speak to an attorney to consider her rehab/discharge options.  CSW requested that CIR review the patient for admission; CIR determined the patient is not appropriate for CIR and meet criteria for a lower level of care. CSW made HHPT referral to [facility] with [place] Ortho PA updated. TOC to monitor for patient's progress with PT.  Expected Discharge Plan: [place] Barriers to Discharge: Continued Medical Work up  Patient Goals and CMS Choice CMS Medicare.gov Compare Post Acute Care list provided to:: Patient Choice offered to / list presented to : Patient  Expected Discharge Plan and Services Expected Discharge Plan: [place] In-house Referral: Clinical Social  Work Post Acute Care Choice: [place] arrangements for the past 2 months: [place]: PT [place]: [place] Date [place]: 07/03/21 Representative spoke with at [place]: Ronalee Belts  Prior Living Arrangements/Services Living arrangements for the past 2 months: [place] Lives with:: Spouse Patient language and need for interpreter reviewed:: Yes Do you feel safe going back to the place where you live?: Yes      Need for Family Participation in Patient Care: Yes (Comment) Care giver support system in place?: Yes (comment) Criminal Activity/Legal Involvement Pertinent to Current Situation/Hospitalization: No - Comment as needed  Activities of Daily Living Home Assistive Devices/Equipment: Environmental consultant (specify type), Cane (specify quad or straight) ADL Screening (condition at time of admission) Patient's cognitive ability adequate to safely complete daily activities?: Yes Is the patient deaf or have difficulty hearing?: No Does the patient have difficulty seeing, even when wearing glasses/contacts?: No Does the patient have difficulty concentrating, remembering, or making decisions?: No Patient able to express need for assistance with ADLs?: Yes Does the patient have difficulty dressing or bathing?: No Independently performs ADLs?: Yes (appropriate for developmental age) Does the patient have difficulty walking or climbing stairs?: Yes Weakness of Legs: Left Weakness of Arms/Hands: None  Permission Sought/Granted Permission sought to share information with : Other (comment) Permission granted to share information with : Yes, Verbal Permission Granted Permission granted to share info w AGENCY: [place] agencies  Emotional Assessment Appearance:: Appears stated age Attitude/Demeanor/Rapport: Reactive, Aggressive (Verbally and/or physically) Affect (typically observed): Agitated Orientation: : Oriented to  Self, Oriented to Place,  Oriented to  Time, Oriented to Situation Alcohol / Substance Use: Not Applicable Psych Involvement: No (comment)  Admission diagnosis:  S/P total left hip arthroplasty [Y10.175] Patient Active Problem List   Diagnosis Date Noted   OA (osteoarthritis) of hip 07/02/2021   S/P total left hip arthroplasty 07/02/2021   PCP:  Lin Landsman, MD Pharmacy:   [facility] [place], [place] - [address] AT [address] [place] [address] Phone: [phone] Fax: [place], [place] [address] Phone: [phone] Fax: [phone]  Readmission Risk Interventions No flowsheet data found.

## 2021-07-03 NOTE — Progress Notes (Signed)
The NT reported the patient's Temp as 103, HR:124.  Jonelle Sidle, PA notified of temp.  Tylenol order received.

## 2021-07-03 NOTE — Progress Notes (Signed)
Temp recheck, : 99.4.  The patient DOES NOT feel hot to the touch.  Tylenol '1000mg'$  PO given.  The patient denies any SOB, chest pain, worsening hip pain.  IS encouraged. No escalation in VS warranted at this time.

## 2021-07-03 NOTE — Plan of Care (Signed)

## 2021-07-03 NOTE — Progress Notes (Signed)
Physical Therapy Treatment Patient Details Name: Evelyn Butler [facility] MRN: [id number] DOB: 05/07/1966 Today's Date: 07/03/2021    History of Present Illness Pt is a 55 y.o. female s/p Lt THA posterior approach. PMH significant for HTN and  pre-diabetes.    PT Comments    Pt very cooperative and progressing with mobility but slowly and continues to require increased time and significant assist for performance of basic mobility tasks. Pt advises that spouse is very limited in ability to physically assist.   Follow Up Recommendations  Follow surgeon's recommendation for DC plan and follow-up therapies     Equipment Recommendations  Rolling walker with 5" wheels;3in1 (PT) (Wide youth level RW 2* pt height and body habitus)    Recommendations for Other Services OT consult     Precautions / Restrictions Precautions Precautions: Posterior Hip Precaution Comments: Pt recalls 3/3 THP without cues.  All precautions reviewed Restrictions Weight Bearing Restrictions: No RLE Weight Bearing: Weight bearing as tolerated LLE Weight Bearing: Weight bearing as tolerated    Mobility  Bed Mobility Overal bed mobility: Needs Assistance Bed Mobility: Sit to Supine       Sit to supine: Mod assist;+2 for safety/equipment   General bed mobility comments: Increased time with cues for sequence; Physical assist to manage LEs and to control trunk    Transfers Overall transfer level: Needs assistance Equipment used: Rolling walker (2 wheeled) Transfers: Sit to/from Stand Sit to Stand: Min assist;Mod assist;+2 physical assistance;+2 safety/equipment         General transfer comment: cues for LE management and use of UEs to self assist.  Physical assist to bring wt up and fwd and to balance in initial standing.  Ambulation/Gait Ambulation/Gait assistance: Min assist Gait Distance (Feet): 32 Feet Assistive device: Rolling walker (2 wheeled) Gait Pattern/deviations: Step-to  pattern;Decreased step length - right;Decreased step length - left;Shuffle;Trunk flexed Gait velocity: decr   General Gait Details: Increased time with cues for sequence, posture and position from RW.  Multiple standing rest breaks required for task completion   Stairs             Wheelchair Mobility    Modified Rankin (Stroke Patients Only)       Balance Overall balance assessment: Needs assistance Sitting-balance support: No upper extremity supported;Feet supported Sitting balance-Leahy Scale: Good     Standing balance support: Bilateral upper extremity supported Standing balance-Leahy Scale: Poor                              Cognition Arousal/Alertness: Awake/alert Behavior During Therapy: WFL for tasks assessed/performed Overall Cognitive Status: Within Functional Limits for tasks assessed                                        Exercises      General Comments        Pertinent Vitals/Pain Pain Assessment: Faces Faces Pain Scale: Hurts little more Pain Location: Lt and Rt hip Pain Descriptors / Indicators: Sore;Tender;Discomfort Pain Intervention(s): Limited activity within patient's tolerance;Monitored during session;Premedicated before session    Home Living                      Prior Function            PT Goals (current goals can now be found in the care plan section) Acute  Rehab PT Goals Patient Stated Goal: Be independent and have less pain PT Goal Formulation: With patient Time For Goal Achievement: 07/16/21 Potential to Achieve Goals: Good Progress towards PT goals: Progressing toward goals    Frequency    7X/week      PT Plan Current plan remains appropriate    Co-evaluation              AM-PAC PT "6 Clicks" Mobility   Outcome Measure  Help needed turning from your back to your side while in a flat bed without using bedrails?: A Lot Help needed moving from lying on your back to  sitting on the side of a flat bed without using bedrails?: A Lot Help needed moving to and from a bed to a chair (including a wheelchair)?: A Lot Help needed standing up from a chair using your arms (e.g., wheelchair or bedside chair)?: A Lot Help needed to walk in hospital room?: A Lot Help needed climbing 3-5 steps with a railing? : Total 6 Click Score: 11    End of Session Equipment Utilized During Treatment: Gait belt Activity Tolerance: Patient limited by fatigue Patient left: in bed;with call bell/phone within reach;with nursing/sitter in room;with family/visitor present Nurse Communication: Mobility status PT Visit Diagnosis: Muscle weakness (generalized) (M62.81);Pain;Difficulty in walking, not elsewhere classified (R26.2) Pain - Right/Left: Left Pain - part of body: Hip     Time: [id number] PT Time Calculation (min) (ACUTE ONLY): 32 min  Charges:  $Gait Training: 8-22 mins $Therapeutic Activity: 8-22 mins                     [place] Pager [phone] Office [phone]    Habiba Treloar 07/03/2021, 4:39 PM

## 2021-07-03 NOTE — Progress Notes (Signed)
Inpatient Rehab Admissions Coordinator:   Per patient request, she was screened for CIR candidacy by Shann Medal, PT, DPT.  Pt does not have the medical necessity to support a CIR admission at this time, and BCBS will not approve admission.  We would recommend f/u at a lower level of care, HH versus SNF per primary therapy and medical team recommendation.  Please contact me with questions.   Shann Medal, PT, DPT Admissions Coordinator [phone] 07/03/21  10:21 AM

## 2021-07-03 NOTE — Progress Notes (Signed)
   Subjective: 1 Day Post-Op Procedure(s) (LRB): TOTAL HIP ARTHROPLASTY-POSTERIOR (Left) Patient reports pain as mild.   Patient seen in rounds by Dr. Wynelle Link. Patient is doing well, but having problems with elevated HR, and low hemoglobin. Denies SOB, chest pain, or calf pain. No acute overnight events. Will continue therapy today.     Objective: Vital signs in last 24 hours: Temp:  [95.6 F (35.3 C)-103 F (39.4 C)] 99.3 F (37.4 C) (09/01 0512) Pulse Rate:  [69-124] 124 (09/01 0512) Resp:  [9-20] 18 (09/01 0512) BP: (92-131)/(57-97) 111/59 (09/01 0512) SpO2:  [100 %] 100 % (09/01 0512)  Intake/Output from previous day:  Intake/Output Summary (Last 24 hours) at 07/03/2021 0903 Last data filed at 07/03/2021 0818 Gross per 24 hour  Intake 3047.99 ml  Output 2100 ml  Net 947.99 ml     Intake/Output this shift: Total I/O In: 703.6 [I.V.:431.4; IV Piggyback:272.2] Out: -   Labs: Recent Labs    07/02/21 1725 07/03/21 0347  HGB 12.8 8.1*   Recent Labs    07/02/21 1725 07/03/21 0347  WBC 15.5* 8.1  RBC 4.20 2.74*  HCT 38.7 23.5*  PLT 248 224   Recent Labs    07/02/21 1119 07/03/21 0347  NA 137 138  K 3.8 4.1  CL 108 110  CO2 22 23  BUN 18 18  CREATININE 0.70 0.88  GLUCOSE 98 133*  CALCIUM 9.2 8.6*   No results for input(s): LABPT, INR in the last 72 hours.  Exam: General - Patient is Alert and Oriented Extremity - Neurologically intact Neurovascular intact Sensation intact distally Dorsiflexion/Plantar flexion intact Dressing - dressing C/D/I Motor Function - intact, moving foot and toes well on exam.   Past Medical History:  Diagnosis Date   Fibroid    Hypertension    Pre-diabetes     Assessment/Plan: 1 Day Post-Op Procedure(s) (LRB): TOTAL HIP ARTHROPLASTY-POSTERIOR (Left) Principal Problem:   OA (osteoarthritis) of hip Active Problems:   S/P total left hip arthroplasty  Estimated body mass index is 46.48 kg/m as calculated from the  following:   Height as of this encounter: 5' (1.524 m).   Weight as of this encounter: 108 kg. Up with therapy  DVT Prophylaxis - Aspirin and TED hose Weight bearing as tolerated D/C knee immobilizer Hemovac pulled without difficulty Begin therapy Hip precautions discussed with patient  Plan is to go Home after hospital stay.  Hemoglobin 8.1 this am. Will repeat labs around 12:00pm today, and continue to monitor. Gave 500 bolus this am due to soft BP.   Plan for two sessions with PT today.   Patient to follow up in two weeks with Dr. Wynelle Link in clinic.   Fenton Foy, MBA, PA-C Orthopedic Surgery 07/03/2021, 9:03 AM

## 2021-07-04 DIAGNOSIS — M1612 Unilateral primary osteoarthritis, left hip: Secondary | ICD-10-CM | POA: Diagnosis present

## 2021-07-04 DIAGNOSIS — R7303 Prediabetes: Secondary | ICD-10-CM | POA: Diagnosis present

## 2021-07-04 DIAGNOSIS — Z6841 Body Mass Index (BMI) 40.0 and over, adult: Secondary | ICD-10-CM | POA: Diagnosis not present

## 2021-07-04 DIAGNOSIS — Z885 Allergy status to narcotic agent status: Secondary | ICD-10-CM | POA: Diagnosis not present

## 2021-07-04 DIAGNOSIS — Z888 Allergy status to other drugs, medicaments and biological substances status: Secondary | ICD-10-CM | POA: Diagnosis not present

## 2021-07-04 DIAGNOSIS — Z791 Long term (current) use of non-steroidal anti-inflammatories (NSAID): Secondary | ICD-10-CM | POA: Diagnosis not present

## 2021-07-04 DIAGNOSIS — Z833 Family history of diabetes mellitus: Secondary | ICD-10-CM | POA: Diagnosis not present

## 2021-07-04 DIAGNOSIS — Z881 Allergy status to other antibiotic agents status: Secondary | ICD-10-CM | POA: Diagnosis not present

## 2021-07-04 DIAGNOSIS — Z87891 Personal history of nicotine dependence: Secondary | ICD-10-CM | POA: Diagnosis not present

## 2021-07-04 DIAGNOSIS — D62 Acute posthemorrhagic anemia: Secondary | ICD-10-CM | POA: Diagnosis not present

## 2021-07-04 DIAGNOSIS — Z91048 Other nonmedicinal substance allergy status: Secondary | ICD-10-CM | POA: Diagnosis not present

## 2021-07-04 DIAGNOSIS — I10 Essential (primary) hypertension: Secondary | ICD-10-CM | POA: Diagnosis present

## 2021-07-04 DIAGNOSIS — Z91018 Allergy to other foods: Secondary | ICD-10-CM | POA: Diagnosis not present

## 2021-07-04 DIAGNOSIS — Z7982 Long term (current) use of aspirin: Secondary | ICD-10-CM | POA: Diagnosis not present

## 2021-07-04 DIAGNOSIS — Z79899 Other long term (current) drug therapy: Secondary | ICD-10-CM | POA: Diagnosis not present

## 2021-07-04 DIAGNOSIS — Z882 Allergy status to sulfonamides status: Secondary | ICD-10-CM | POA: Diagnosis not present

## 2021-07-04 DIAGNOSIS — Z8249 Family history of ischemic heart disease and other diseases of the circulatory system: Secondary | ICD-10-CM | POA: Diagnosis not present

## 2021-07-04 LAB — CBC
HCT: 18.6 % — ABNORMAL LOW (ref 36.0–46.0)
HCT: 18.8 % — ABNORMAL LOW (ref 36.0–46.0)
HCT: 24.2 % — ABNORMAL LOW (ref 36.0–46.0)
Hemoglobin: 6.4 g/dL — CL (ref 12.0–15.0)
Hemoglobin: 6.4 g/dL — CL (ref 12.0–15.0)
Hemoglobin: 8.2 g/dL — ABNORMAL LOW (ref 12.0–15.0)
MCH: 29.5 pg (ref 26.0–34.0)
MCH: 29.4 pg (ref 26.0–34.0)
MCH: 29 pg (ref 26.0–34.0)
MCHC: 34.4 g/dL (ref 30.0–36.0)
MCHC: 34 g/dL (ref 30.0–36.0)
MCHC: 33.9 g/dL (ref 30.0–36.0)
MCV: 85.7 fL (ref 80.0–100.0)
MCV: 86.2 fL (ref 80.0–100.0)
MCV: 85.5 fL (ref 80.0–100.0)
Platelets: 180 10*3/uL (ref 150–400)
Platelets: 181 10*3/uL (ref 150–400)
Platelets: 175 10*3/uL (ref 150–400)
RBC: 2.17 MIL/uL — ABNORMAL LOW (ref 3.87–5.11)
RBC: 2.18 MIL/uL — ABNORMAL LOW (ref 3.87–5.11)
RBC: 2.83 MIL/uL — ABNORMAL LOW (ref 3.87–5.11)
RDW: 13.5 % (ref 11.5–15.5)
RDW: 13.6 % (ref 11.5–15.5)
RDW: 14.5 % (ref 11.5–15.5)
WBC: 10.3 10*3/uL (ref 4.0–10.5)
WBC: 10.6 10*3/uL — ABNORMAL HIGH (ref 4.0–10.5)
WBC: 11.2 10*3/uL — ABNORMAL HIGH (ref 4.0–10.5)
nRBC: 0 % (ref 0.0–0.2)
nRBC: 0 % (ref 0.0–0.2)
nRBC: 0 % (ref 0.0–0.2)

## 2021-07-04 LAB — BASIC METABOLIC PANEL
Anion gap: 5 (ref 5–15)
BUN: 20 mg/dL (ref 6–20)
CO2: 23 mmol/L (ref 22–32)
Calcium: 8.7 mg/dL — ABNORMAL LOW (ref 8.9–10.3)
Chloride: 115 mmol/L — ABNORMAL HIGH (ref 98–111)
Creatinine, Ser: 0.6 mg/dL (ref 0.44–1.00)
GFR, Estimated: >60 mL/min (ref >60)
Glucose, Bld: 111 mg/dL — ABNORMAL HIGH (ref 70–99)
Potassium: 4.1 mmol/L (ref 3.5–5.1)
Sodium: 143 mmol/L (ref 135–145)

## 2021-07-04 LAB — PREPARE RBC (CROSSMATCH)

## 2021-07-04 MED ORDER — SODIUM CHLORIDE 0.9% IV SOLUTION: Freq: Once | INTRAVENOUS | Status: AC

## 2021-07-04 NOTE — TOC Progression Note (Signed)
Transition of Care [facility]) - Progression Note   Patient Details  Name: Evelyn Butler MRN: [id number] Date of Birth: [birth year]  Transition of Care [facility]) CM/SW [place], LCSW Phone Number: 07/04/2021, 3:48 PM  Clinical Narrative: PT evaluation recommended a youth rolling walker and 3N1. Patient is agreeable to DME referral. CSW made DME referral to [employer]. [employer] to deliver DME to patient's room.  Expected Discharge Plan: [place] Barriers to Discharge: Continued Medical Work up  Expected Discharge Plan and Services Expected Discharge Plan: [place] In-house Referral: Clinical Social Work Post Acute Care Choice: [place] arrangements for the past 2 months: [place]              DME Arranged: 3-N-1, Environmental consultant youth DME Agency: [employer] Date DME Agency Contacted: 07/04/21 Representative spoke with at DME Agency: Annie Sable HH Arranged: PT [place]: [place] Date [place]: 07/03/21 Representative spoke with at [place]: Ronalee Belts  Readmission Risk Interventions No flowsheet data found.

## 2021-07-04 NOTE — Progress Notes (Signed)
Physical Therapy Treatment Patient Details Name: Evelyn Butler [facility] MRN: [id number] DOB: Feb 19, 1966 Today's Date: 07/04/2021    History of Present Illness Pt is a 55 y.o. female s/p Lt THA posterior approach. PMH significant for HTN and  pre-diabetes.    PT Comments    Pt performed therex program with assist.  OOB deferred this am - pt with Hgb 6.4 and transfusion scheduled.   Follow Up Recommendations  Follow surgeon's recommendation for DC plan and follow-up therapies     Equipment Recommendations  Rolling walker with 5" wheels;3in1 (PT)    Recommendations for Other Services OT consult     Precautions / Restrictions Precautions Precautions: Posterior Hip Precaution Comments: Pt recalls 3/3 THP without cues.  All precautions reviewed Restrictions Weight Bearing Restrictions: No RLE Weight Bearing: Weight bearing as tolerated    Mobility  Bed Mobility               General bed mobility comments: deferred to after transfusion    Transfers                    Ambulation/Gait                 Stairs             Wheelchair Mobility    Modified Rankin (Stroke Patients Only)       Balance                                            Cognition Arousal/Alertness: Awake/alert Behavior During Therapy: WFL for tasks assessed/performed Overall Cognitive Status: Within Functional Limits for tasks assessed                                        Exercises Total Joint Exercises Ankle Circles/Pumps: AROM;Both;20 reps;Supine Quad Sets: AROM;Both;10 reps;Supine Heel Slides: AAROM;Left;20 reps;Supine Hip ABduction/ADduction: AAROM;Left;15 reps;Supine    General Comments        Pertinent Vitals/Pain Pain Assessment: 0-10 Pain Score: 4  Pain Location: Lt and Rt hip Pain Descriptors / Indicators: Sore;Tender;Discomfort Pain Intervention(s): Limited activity within patient's tolerance;Monitored during  session;Premedicated before session    Home Living                      Prior Function            PT Goals (current goals can now be found in the care plan section) Acute Rehab PT Goals Patient Stated Goal: Be independent and have less pain PT Goal Formulation: With patient Time For Goal Achievement: 07/16/21 Potential to Achieve Goals: Good Progress towards PT goals: Progressing toward goals    Frequency    7X/week      PT Plan Current plan remains appropriate    Co-evaluation              AM-PAC PT "6 Clicks" Mobility   Outcome Measure  Help needed turning from your back to your side while in a flat bed without using bedrails?: A Lot Help needed moving from lying on your back to sitting on the side of a flat bed without using bedrails?: A Lot Help needed moving to and from a bed to a chair (including a wheelchair)?: A Lot Help needed standing up from a  chair using your arms (e.g., wheelchair or bedside chair)?: A Lot Help needed to walk in hospital room?: A Lot Help needed climbing 3-5 steps with a railing? : Total 6 Click Score: 11    End of Session   Activity Tolerance: Patient tolerated treatment well Patient left: in bed;with call bell/phone within reach Nurse Communication: Mobility status PT Visit Diagnosis: Muscle weakness (generalized) (M62.81);Pain;Difficulty in walking, not elsewhere classified (R26.2) Pain - Right/Left: Left Pain - part of body: Hip     Time: [id number] PT Time Calculation (min) (ACUTE ONLY): 25 min  Charges:  $Therapeutic Exercise: 8-22 mins                     [place] Pager [phone] Office [phone]    Indianna Boran 07/04/2021, 10:33 AM

## 2021-07-04 NOTE — Plan of Care (Signed)
Started 1unit PRBC transfusion, no adverse reactions noted, VS taken and recorded, will continue to monitor Pt.   Problem: Health Behavior/Discharge Planning: Goal: Ability to manage health-related needs will improve Outcome: Progressing   Problem: Clinical Measurements: Goal: Ability to maintain clinical measurements within normal limits will improve Outcome: Progressing   Problem: Activity: Goal: Risk for activity intolerance will decrease Outcome: Progressing   Problem: Coping: Goal: Level of anxiety will decrease Outcome: Progressing   Problem: Pain Managment: Goal: General experience of comfort will improve Outcome: Progressing   Problem: Safety: Goal: Ability to remain free from injury will improve Outcome: Progressing   Problem: Skin Integrity: Goal: Risk for impaired skin integrity will decrease Outcome: Progressing

## 2021-07-04 NOTE — Progress Notes (Signed)
Pt completed 2units PRBC transfusion, vital signs recorded, no adverse reactions noted, Pt not in acute distress, states she feels better than this morning.

## 2021-07-04 NOTE — Progress Notes (Signed)
   Subjective: 2 Days Post-Op Procedure(s) (LRB): TOTAL HIP ARTHROPLASTY-POSTERIOR (Left) Patient reports pain as mild.   Patient seen in rounds by Dr. Wynelle Link. Patient is having problems with anemia . Denies SOB, chest pain, or calf pain. No acute overnight events. Ambulated 32 feet with therapy yesterday. Will continue therapy today.   Plan is to go home with home health  after hospital stay.  Objective: Vital signs in last 24 hours: Temp:  [98.6 F (37 C)-99.7 F (37.6 C)] 98.6 F (37 C) (09/02 0454) Pulse Rate:  [100-116] 102 (09/02 0454) Resp:  [16-19] 16 (09/02 0454) BP: (102-127)/(51-70) 110/64 (09/02 0454) SpO2:  [100 %] 100 % (09/02 0454)  Intake/Output from previous day:  Intake/Output Summary (Last 24 hours) at 07/04/2021 0740 Last data filed at 07/04/2021 0600 Gross per 24 hour  Intake 3968.33 ml  Output 2350 ml  Net 1618.33 ml    Intake/Output this shift: No intake/output data recorded.  Labs: Recent Labs    07/02/21 1725 07/03/21 0347 07/03/21 1303 07/04/21 0331 07/04/21 0446  HGB 12.8 8.1* 7.6* 6.4* 6.4*   Recent Labs    07/04/21 0331 07/04/21 0446  WBC 10.3 10.6*  RBC 2.17* 2.18*  HCT 18.6* 18.8*  PLT 180 181   Recent Labs    07/03/21 0347 07/04/21 0331  NA 138 143  K 4.1 4.1  CL 110 115*  CO2 23 23  BUN 18 20  CREATININE 0.88 0.60  GLUCOSE 133* 111*  CALCIUM 8.6* 8.7*   No results for input(s): LABPT, INR in the last 72 hours.  Exam: General - Patient is Alert and Oriented Extremity - Neurologically intact Neurovascular intact Intact pulses distally Dorsiflexion/Plantar flexion intact Dressing/Incision - clean, dry, no drainage Motor Function - intact, moving foot and toes well on exam.   Past Medical History:  Diagnosis Date   Fibroid    Hypertension    Pre-diabetes     Assessment/Plan: 2 Days Post-Op Procedure(s) (LRB): TOTAL HIP ARTHROPLASTY-POSTERIOR (Left) Principal Problem:   OA (osteoarthritis) of hip Active  Problems:   S/P total left hip arthroplasty  Estimated body mass index is 46.48 kg/m as calculated from the following:   Height as of this encounter: 5' (1.524 m).   Weight as of this encounter: 108 kg. Up with therapy Discharge home with home health on Sunday at the earliest.   DVT Prophylaxis - Aspirin and TED hose Weight-bearing as tolerated  Hemoglobin 6.4 this am. Two units of blood given. Will repeat CBC this afternoon, and continue to follow on progression on Saturday/Sunday.   Discussed plan with patient, and the plan is to go home with home health PT, at the earliest Sunday.   Plan for two sessions with PT today.  Patient to follow up in two weeks with Dr. Wynelle Link in clinic.   The PDMP database was reviewed today prior to any opioid medications being prescribed to this patient.Fenton Foy, MBA, PA-C Orthopedic Surgery 07/04/2021, 7:40 AM

## 2021-07-04 NOTE — Progress Notes (Signed)
Physical Therapy Treatment Patient Details Name: Evelyn Butler [facility] MRN: [id number] DOB: September 15, 1966 Today's Date: 07/04/2021    History of Present Illness Pt is a 55 y.o. female s/p Lt THA posterior approach. PMH significant for HTN and  pre-diabetes.    PT Comments    Pt assisted up to EOB sitting and balanced at bedside but with c/o dizziness and feeling warm with increased time in sitting - BP 103/65.  Pt assisted back to bed and RN aware.     Follow Up Recommendations  Follow surgeon's recommendation for DC plan and follow-up therapies     Equipment Recommendations  Rolling walker with 5" wheels;3in1 (PT)    Recommendations for Other Services OT consult     Precautions / Restrictions Precautions Precautions: Posterior Hip Precaution Comments: Pt recalls 3/3 THP without cues.  All precautions reviewed Restrictions Weight Bearing Restrictions: No RLE Weight Bearing: Weight bearing as tolerated LLE Weight Bearing: Weight bearing as tolerated    Mobility  Bed Mobility Overal bed mobility: Needs Assistance Bed Mobility: Supine to Sit;Sit to Supine     Supine to sit: Mod assist;HOB elevated Sit to supine: Mod assist;+2 for safety/equipment   General bed mobility comments: INcreased time with cues for sequence, use of bed rails, assist with LEs and to complete transition to EOB sitting using bed pad; Assist for LEs, and heavy use of bed pad to rotate pt back into bed    Transfers                 General transfer comment: Pt to sitting only - ltd by c/o dizziness in sitting  Ambulation/Gait                 Stairs             Wheelchair Mobility    Modified Rankin (Stroke Patients Only)       Balance Overall balance assessment: Needs assistance Sitting-balance support: Single extremity supported Sitting balance-Leahy Scale: Fair                                      Cognition Arousal/Alertness: Awake/alert Behavior  During Therapy: WFL for tasks assessed/performed Overall Cognitive Status: Within Functional Limits for tasks assessed                                        Exercises Total Joint Exercises Ankle Circles/Pumps: AROM;Both;20 reps;Supine Quad Sets: AROM;Both;10 reps;Supine Heel Slides: AAROM;Left;20 reps;Supine Hip ABduction/ADduction: AAROM;Left;15 reps;Supine    General Comments        Pertinent Vitals/Pain Pain Assessment: 0-10 Pain Score: 5  Pain Location: Lt and Rt hip Pain Descriptors / Indicators: Sore;Tender;Discomfort Pain Intervention(s): Limited activity within patient's tolerance;Monitored during session;Premedicated before session    Home Living                      Prior Function            PT Goals (current goals can now be found in the care plan section) Acute Rehab PT Goals Patient Stated Goal: Be independent and have less pain PT Goal Formulation: With patient Time For Goal Achievement: 07/16/21 Potential to Achieve Goals: Good Progress towards PT goals: Not progressing toward goals - comment (dizziness in sitting)    Frequency    7X/week  PT Plan Current plan remains appropriate    Co-evaluation PT/OT/SLP Co-Evaluation/Treatment: Yes Reason for Co-Treatment: For patient/therapist safety;To address functional/ADL transfers PT goals addressed during session: Mobility/safety with mobility OT goals addressed during session: ADL's and self-care      AM-PAC PT "6 Clicks" Mobility   Outcome Measure  Help needed turning from your back to your side while in a flat bed without using bedrails?: A Lot Help needed moving from lying on your back to sitting on the side of a flat bed without using bedrails?: A Lot Help needed moving to and from a bed to a chair (including a wheelchair)?: A Lot Help needed standing up from a chair using your arms (e.g., wheelchair or bedside chair)?: A Lot Help needed to walk in hospital  room?: A Lot Help needed climbing 3-5 steps with a railing? : Total 6 Click Score: 11    End of Session Equipment Utilized During Treatment: Gait belt Activity Tolerance: Patient tolerated treatment well Patient left: in bed;with call bell/phone within reach Nurse Communication: Mobility status PT Visit Diagnosis: Muscle weakness (generalized) (M62.81);Pain;Difficulty in walking, not elsewhere classified (R26.2) Pain - Right/Left: Left Pain - part of body: Hip     Time: 1254-1316 PT Time Calculation (min) (ACUTE ONLY): 22 min  Charges:  $Therapeutic Exercise: 8-22 mins $Therapeutic Activity: 8-22 mins                     [place] Pager [phone] Office [phone]    Jameriah Trotti 07/04/2021, 1:20 PM

## 2021-07-04 NOTE — Progress Notes (Signed)
Occupational Therapy Evaluation  Patient lives with spouse in single level home. Is mod I with self care and uses electric scooter at home and at work. Patient is able to recall 3/3 hip precautions at beginning of session. Limited evaluation due to patient feeling dizzy at edge of bed with soft BP, symptoms did not improve in sitting ~5 mins therefore returned to bed with mod A x2 to lift legs and guide trunk. Did provide demonstration of hip kit items as well as how to set up 3 in 1 over toilet at home as patient states her toilet is "very low." Recommend continued acute OT services to facilitate D/C home, would benefit from [facility] OT to practice functional transfers and self care tasks in home setting.     07/04/21 1400  OT Visit Information  Last OT Received On 07/04/21  Assistance Needed +2  PT/OT/SLP Co-Evaluation/Treatment Yes  Reason for Co-Treatment For patient/therapist safety;To address functional/ADL transfers  PT goals addressed during session Mobility/safety with mobility  OT goals addressed during session ADL's and self-care  History of Present Illness Pt is a 55 y.o. female s/p Lt THA posterior approach. PMH significant for HTN and  pre-diabetes.  Precautions  Precautions Posterior Hip  Precaution Comments Pt recalls 3/3 THP without cues.  All precautions reviewed  Restrictions  LLE Weight Bearing WBAT  Home Living  Family/patient expects to be discharged to: Private residence  Living Arrangements Spouse/significant other  Available Help at Discharge Family;Available PRN/intermittently  Type of Home House  Home Access Stairs to enter  Entrance Stairs-Number of Steps 1  Entrance Stairs-Rails None  Home Layout Two level;Able to live on main level with bedroom/bathroom;Full bath on main level  Bathroom Shower/Tub Tub/shower unit (sponge bathes at baseline)  Therapist, occupational - 4 wheels;Adaptive  equipment  Adaptive Equipment Reacher;Long-handled sponge  Additional Comments Pt lives with her husband who is unable to physically assist, daughter available PRN works Paediatric nurse.  Prior Function  Level of Independence Independent with assistive device(s)  Comments rollator for community ambulation and use electronic scooter at work. 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  Communication  Communication No difficulties  Pain Assessment  Pain Assessment 0-10  Pain Score 5  Pain Location Lt and Rt hip  Pain Descriptors / Indicators Sore;Tender;Discomfort  Pain Intervention(s) Patient requesting pain meds-RN notified;Limited activity within patient's tolerance  Cognition  Arousal/Alertness Awake/alert  Behavior During Therapy WFL for tasks assessed/performed  Overall Cognitive Status Within Functional Limits for tasks assessed  Upper Extremity Assessment  Upper Extremity Assessment Overall WFL for tasks assessed  Lower Extremity Assessment  Lower Extremity Assessment Defer to PT evaluation  ADL  Overall ADL's  Needs assistance/impaired  Eating/Feeding Independent  Grooming Set up;Sitting;Bed level  Upper Body Bathing Set up;Sitting;Bed level  Lower Body Bathing Maximal assistance;Sitting/lateral leans;Bed level  Lower Body Bathing Details (indicate cue type and reason) due to hip precautions. patient reports she has a long handle sponge which she uses at home for LB bathing, takes sponge bathes does not get into tub/shower  Upper Body Dressing  Set up;Sitting  Lower Body Dressing Total assistance;Sitting/lateral leans;Bed level  Lower Body Dressing Details (indicate cue type and reason) due to posterior hip precautions. Patient unable to practice with [place] AE due to low BP and symptomatic. Provided a visual demonstration of each item in hip kit. pt verbalize understanding  Toilet Transfer Details (indicate cue type and reason) unable  to assess this session due to  dizziness and soft BP sitting at edge of bed  Toileting- Clothing Manipulation and Hygiene Total assistance;Bed level  General ADL Comments limited evaluation due to patient feeling dizzy and "warm feeling" sitting edge of bed and soft BP. Did educate patient and spouse regarding set up of DME at home such as 3 in 1 over commode as well as demonstration of hip kit items  Bed Mobility  Overal bed mobility Needs Assistance  Bed Mobility Supine to Sit;Sit to Supine  Supine to sit Mod assist;HOB elevated  Sit to supine Mod assist;+2 for safety/equipment  General bed mobility comments Increased time with cues for sequence, use of bed rails, assist with LEs and to complete transition to EOB sitting using bed pad; Assist for LEs, and heavy use of bed pad to rotate pt back into bed  Transfers  General transfer comment Pt to sitting only 2* c/o dizziness in sitting  Balance  Overall balance assessment Needs assistance  Sitting-balance support Single extremity supported  Sitting balance-Leahy Scale Poor  OT - End of Session  Activity Tolerance Treatment limited secondary to medical complications (Comment) (soft BP/symptomatic)  Patient left in bed;with call bell/phone within reach;with family/visitor present  Nurse Communication Mobility status;Other (comment) (soft BP)  OT Assessment  OT Recommendation/Assessment Patient needs continued OT Services  OT Visit Diagnosis Other abnormalities of gait and mobility (R26.89)  OT Problem List Pain;Obesity;Decreased knowledge of use of DME or AE;Decreased safety awareness;Decreased activity tolerance;Impaired balance (sitting and/or standing)  OT Plan  OT Frequency (ACUTE ONLY) Min 2X/week  OT Treatment/Interventions (ACUTE ONLY) Self-care/ADL training;Balance training;Patient/family education;Therapeutic activities;DME and/or AE instruction  AM-PAC OT "6 Clicks" Daily Activity Outcome Measure (Version 2)  Help from another person eating meals? 4  Help  from another person taking care of personal grooming? 3  Help from another person toileting, which includes using toliet, bedpan, or urinal? 1  Help from another person bathing (including washing, rinsing, drying)? 2  Help from another person to put on and taking off regular upper body clothing? 3  Help from another person to put on and taking off regular lower body clothing? 1  6 Click Score 14  Progressive Mobility  What is the highest level of mobility based on the progressive mobility assessment? Level 2 ([place]) - Balance while sitting on edge of bed and cannot stand  Mobility Sit up in bed/chair position for meals  OT Recommendation  Follow Up Recommendations Home health OT;Supervision/Assistance - 24 hour  OT Equipment Other (comment) (bariatric bedside commode; hip kit)  Individuals Consulted  Consulted and Agree with Results and Recommendations Patient  Acute Rehab OT Goals  Patient Stated Goal Be independent and have less pain  OT Goal Formulation With patient  Time For Goal Achievement 07/18/21  Potential to Achieve Goals Good  OT Time Calculation  OT Start Time (ACUTE ONLY) 1251  OT Stop Time (ACUTE ONLY) 1330  OT Time Calculation (min) 39 min  OT General Charges  $OT Visit 1 Visit  OT Evaluation  $OT Eval Low Complexity 1 Low  OT Treatments  $Self Care/Home Management  8-22 mins  Written Expression  Dominant Hand Right   Delbert Phenix OT OT pager: [phone]

## 2021-07-04 NOTE — Progress Notes (Signed)
Pt has critical Hgb of 6.4.  Amanda-PA notified of lab and ordered at repeat CBC stat.  Pt vitals currently stable. RN to call PA back if change in pt's status.  Will await rounding team this morning for further evaluation.

## 2021-07-05 LAB — CBC
HCT: 22.2 % — ABNORMAL LOW (ref 36.0–46.0)
HCT: 25 % — ABNORMAL LOW (ref 36.0–46.0)
Hemoglobin: 7.7 g/dL — ABNORMAL LOW (ref 12.0–15.0)
Hemoglobin: 8.6 g/dL — ABNORMAL LOW (ref 12.0–15.0)
MCH: 29.2 pg (ref 26.0–34.0)
MCH: 29.1 pg (ref 26.0–34.0)
MCHC: 34.7 g/dL (ref 30.0–36.0)
MCHC: 34.4 g/dL (ref 30.0–36.0)
MCV: 84.1 fL (ref 80.0–100.0)
MCV: 84.5 fL (ref 80.0–100.0)
Platelets: 174 10*3/uL (ref 150–400)
Platelets: 213 10*3/uL (ref 150–400)
RBC: 2.64 MIL/uL — ABNORMAL LOW (ref 3.87–5.11)
RBC: 2.96 MIL/uL — ABNORMAL LOW (ref 3.87–5.11)
RDW: 14.6 % (ref 11.5–15.5)
RDW: 14.3 % (ref 11.5–15.5)
WBC: 9.7 10*3/uL (ref 4.0–10.5)
WBC: 11.8 10*3/uL — ABNORMAL HIGH (ref 4.0–10.5)
nRBC: 0 % (ref 0.0–0.2)
nRBC: 0.2 % (ref 0.0–0.2)

## 2021-07-05 MED ORDER — TRAMADOL HCL 50 MG PO TABS: 50.0000 mg | ORAL_TABLET | Freq: Four times a day (QID) | ORAL | 0 refills | Status: DC | PRN

## 2021-07-05 MED ORDER — ASPIRIN 325 MG PO TBEC: 325.0000 mg | DELAYED_RELEASE_TABLET | Freq: Two times a day (BID) | ORAL | 0 refills | Status: DC

## 2021-07-05 MED ORDER — METHOCARBAMOL 500 MG PO TABS: 500.0000 mg | ORAL_TABLET | Freq: Four times a day (QID) | ORAL | 0 refills | Status: DC | PRN

## 2021-07-05 MED ORDER — HYDROMORPHONE HCL 2 MG PO TABS: 2.0000 mg | ORAL_TABLET | Freq: Four times a day (QID) | ORAL | 0 refills | Status: DC | PRN

## 2021-07-05 NOTE — Progress Notes (Signed)
   Subjective: 3 Days Post-Op Procedure(s) (LRB): TOTAL HIP ARTHROPLASTY-POSTERIOR (Left) Patient reports pain as mild.   Patient working with physical therapy at time of exam Patient is having problems with anemia . Denies SOB, chest pain, or calf pain. No acute overnight events. Will continue therapy today.   Plan is to go protect  home with home health  after hospital stay.  Objective: Vital signs in last 24 hours: Temp:  [98.4 F (36.9 C)-98.9 F (37.2 C)] 98.5 F (36.9 C) (09/03 0505) Pulse Rate:  [96-110] 96 (09/03 0505) Resp:  [16-17] 16 (09/03 0505) BP: (101-134)/(50-71) 116/64 (09/03 0505) SpO2:  [99 %-100 %] 100 % (09/03 0505)  Intake/Output from previous day:  Intake/Output Summary (Last 24 hours) at 07/05/2021 0916 Last data filed at 07/05/2021 0600 Gross per 24 hour  Intake 3505.47 ml  Output 1700 ml  Net 1805.47 ml     Intake/Output this shift: No intake/output data recorded.  Labs: Recent Labs    07/03/21 1303 07/04/21 0331 07/04/21 0446 07/04/21 1702 07/05/21 0320  HGB 7.6* 6.4* 6.4* 8.2* 7.7*    Recent Labs    07/04/21 1702 07/05/21 0320  WBC 11.2* 9.7  RBC 2.83* 2.64*  HCT 24.2* 22.2*  PLT 175 174    Recent Labs    07/03/21 0347 07/04/21 0331  NA 138 143  K 4.1 4.1  CL 110 115*  CO2 23 23  BUN 18 20  CREATININE 0.88 0.60  GLUCOSE 133* 111*  CALCIUM 8.6* 8.7*    No results for input(s): LABPT, INR in the last 72 hours.  Exam: General - Patient is Alert and Oriented Extremity - Neurologically intact Neurovascular intact Intact pulses distally Dorsiflexion/Plantar flexion intact Dressing/Incision - clean, dry, no drainage Motor Function - intact, moving foot and toes well on exam.   Past Medical History:  Diagnosis Date   Fibroid    Hypertension    Pre-diabetes     Assessment/Plan: 3 Days Post-Op Procedure(s) (LRB): TOTAL HIP ARTHROPLASTY-POSTERIOR (Left) Principal Problem:   OA (osteoarthritis) of hip Active  Problems:   S/P total left hip arthroplasty   Primary osteoarthritis of left hip  Estimated body mass index is 46.48 kg/m as calculated from the following:   Height as of this encounter: 5' (1.524 m).   Weight as of this encounter: 108 kg. Up with therapy Discharge home with home health on Sunday at the earliest.   DVT Prophylaxis - Aspirin and TED hose Weight-bearing as tolerated  Hemoglobin 7.7 this am. Will repeat CBC this afternoon, and continue to follow on progression on Sunday. Will transfuse if below 7.0  The plan is to go home with home health PT, at the earliest Sunday.   Continue PT today.  Patient to follow up in two weeks with Dr. Wynelle Link in clinic.   The PDMP database was reviewed today prior to any opioid medications being prescribed to this patient.Cherlynn June, PA-C Orthopedic Surgery 07/05/2021, 9:16 AM

## 2021-07-05 NOTE — Progress Notes (Signed)
Physical Therapy Treatment Patient Details Name: Evelyn Butler [facility] MRN: [id number] DOB: [birth year] Today's Date: 07/05/2021    History of Present Illness Pt is a 55 y.o. female s/p Lt THA posterior approach. PMH significant for HTN and  pre-diabetes.    PT Comments    Pt continues in good spirits but fatigues easily 2* premorbid deconditionineg, severe arthritis in non-operative hip and pain.  Hgb at 7.6 this am but pt denies dizziness with mobility.  Follow Up Recommendations  Follow surgeon's recommendation for DC plan and follow-up therapies     Equipment Recommendations  Rolling walker with 5" wheels;3in1 (PT) (wide RW)    Recommendations for Other Services OT consult     Precautions / Restrictions Precautions Precautions: Posterior Hip Precaution Comments: Pt recalls 3/3 THP without cues.  All precautions reviewed Restrictions Weight Bearing Restrictions: No RLE Weight Bearing: Weight bearing as tolerated LLE Weight Bearing: Weight bearing as tolerated    Mobility  Bed Mobility               General bed mobility comments: Pt up in chair and requests back to same    Transfers Overall transfer level: Needs assistance Equipment used: Rolling walker (2 wheeled) Transfers: Sit to/from Stand Sit to Stand: Min assist;Mod assist         General transfer comment: cues for use of UEs to self assist. Physical assist to bring wt up and fwd and to balance in standing  Ambulation/Gait Ambulation/Gait assistance: Min assist Gait Distance (Feet): 18 Feet Assistive device: Rolling walker (2 wheeled) Gait Pattern/deviations: Step-to pattern;Decreased step length - right;Decreased step length - left;Shuffle;Trunk flexed Gait velocity: decr   General Gait Details: Increased time with cues for sequence, posture and position from RW.  Multiple standing rest breaks required for task completion   Stairs             Wheelchair Mobility    Modified Rankin  (Stroke Patients Only)       Balance Overall balance assessment: Needs assistance Sitting-balance support: Feet supported;No upper extremity supported Sitting balance-Leahy Scale: Good Sitting balance - Comments: Pt required use of at least single UE support for seated balance, intermittent CGA from therapist   Standing balance support: Single extremity supported Standing balance-Leahy Scale: Poor                              Cognition Arousal/Alertness: Awake/alert Behavior During Therapy: WFL for tasks assessed/performed Overall Cognitive Status: Within Functional Limits for tasks assessed                                        Exercises      General Comments        Pertinent Vitals/Pain Pain Assessment: 0-10 Pain Score: 5  Pain Location: Lt and Rt hip Pain Descriptors / Indicators: Sore;Tender;Discomfort Pain Intervention(s): Limited activity within patient's tolerance    Home Living                      Prior Function            PT Goals (current goals can now be found in the care plan section) Acute Rehab PT Goals Patient Stated Goal: Be independent and have less pain PT Goal Formulation: With patient Time For Goal Achievement: 07/16/21 Potential to Achieve Goals: Good Progress towards  PT goals: Progressing toward goals    Frequency    7X/week      PT Plan Current plan remains appropriate    Co-evaluation              AM-PAC PT "6 Clicks" Mobility   Outcome Measure  Help needed turning from your back to your side while in a flat bed without using bedrails?: A Lot Help needed moving from lying on your back to sitting on the side of a flat bed without using bedrails?: A Lot Help needed moving to and from a bed to a chair (including a wheelchair)?: A Lot Help needed standing up from a chair using your arms (e.g., wheelchair or bedside chair)?: A Lot Help needed to walk in hospital room?: A Little Help  needed climbing 3-5 steps with a railing? : Total 6 Click Score: 12    End of Session Equipment Utilized During Treatment: Gait belt Activity Tolerance: Patient limited by fatigue;Patient limited by pain;Patient tolerated treatment well Patient left: in chair;with call bell/phone within reach;with chair alarm set;with family/visitor present Nurse Communication: Mobility status PT Visit Diagnosis: Muscle weakness (generalized) (M62.81);Pain;Difficulty in walking, not elsewhere classified (R26.2) Pain - Right/Left: Left Pain - part of body: Hip     Time: 1410-1434 PT Time Calculation (min) (ACUTE ONLY): 24 min  Charges:  $Gait Training: 23-37 mins                     [place] Pager [phone] Office [phone]    Nalini Alcaraz 07/05/2021, 2:38 PM

## 2021-07-05 NOTE — TOC Progression Note (Signed)
Transition of Care [facility]) - Progression Note    Patient Details  Name: Johnise Odoms [facility] MRN: [id number] Date of Birth: [birth year]  Transition of [facility] CM/SW Contact  Lennart Pall, LCSW Phone Number: 07/05/2021, 2:45 PM  Clinical Narrative:    Alerted by RN that walker needed to be changed from youth rw to wide rw.  Have contacted [facility]) ant she will have those walkers switched out in pt's room.    Expected Discharge Plan: [place] Barriers to Discharge: Continued Medical Work up  Expected Discharge Plan and Services Expected Discharge Plan: [place] In-house Referral: Clinical Social Work   Post Acute Care Choice: [place] arrangements for the past 2 months: [place] Expected Discharge Date: 07/05/21               DME Arranged: Berta Minor wide DME Agency: [facility] Date DME Agency Contacted: 07/04/21   Representative spoke with at DME Agency: [place]: PT Page: [place] Date [place]: 07/03/21   Representative spoke with at [place]: [place] ([place]) Interventions    Readmission Risk Interventions No flowsheet data found.

## 2021-07-05 NOTE — Progress Notes (Signed)
Physical Therapy Treatment Patient Details Name: Evelyn Butler [facility] MRN: [id number] DOB: 1966-01-06 Today's Date: 07/05/2021    History of Present Illness Pt is a 55 y.o. female s/p Lt THA posterior approach. PMH significant for HTN and  pre-diabetes.    PT Comments    Pt in good spirits and with noted improvement in activity tolerance and no c/o dizziness with mobility.  Pt continues to require increased time for all tasks but with decreasing assist level. Pt most limited by body habitus and severe arthritis in non-operative hip.   Follow Up Recommendations  Follow surgeon's recommendation for DC plan and follow-up therapies     Equipment Recommendations  Rolling walker with 5" wheels;3in1 (PT)    Recommendations for Other Services OT consult     Precautions / Restrictions Precautions Precautions: Posterior Hip Precaution Comments: Pt recalls 3/3 THP without cues.  All precautions reviewed Restrictions Weight Bearing Restrictions: No RLE Weight Bearing: Weight bearing as tolerated LLE Weight Bearing: Weight bearing as tolerated    Mobility  Bed Mobility Overal bed mobility: Needs Assistance Bed Mobility: Supine to Sit     Supine to sit: Min assist;+2 for physical assistance     General bed mobility comments: Increased time with cues for sequence, use of bed rail, assist with LEs and to complete transition to EOB sitting using bed pad    Transfers Overall transfer level: Needs assistance Equipment used: Rolling walker (2 wheeled) Transfers: Sit to/from Stand Sit to Stand: Min assist;Mod assist;+2 physical assistance;+2 safety/equipment         General transfer comment: cues for use of UEs to self assist. Physical assist to bring wt up and fwd and to balance in standing  Ambulation/Gait Ambulation/Gait assistance: Min assist;+2 safety/equipment (chair follow) Gait Distance (Feet): 16 Feet Assistive device: Rolling walker (2 wheeled) Gait  Pattern/deviations: Step-to pattern;Decreased step length - right;Decreased step length - left;Shuffle;Trunk flexed Gait velocity: decr   General Gait Details: Increased time with cues for sequence, posture and position from RW.  Multiple standing rest breaks required for task completion   Stairs             Wheelchair Mobility    Modified Rankin (Stroke Patients Only)       Balance Overall balance assessment: Needs assistance Sitting-balance support: Feet supported;No upper extremity supported Sitting balance-Leahy Scale: Good     Standing balance support: Bilateral upper extremity supported Standing balance-Leahy Scale: Poor                              Cognition Arousal/Alertness: Awake/alert Behavior During Therapy: WFL for tasks assessed/performed Overall Cognitive Status: Within Functional Limits for tasks assessed                                        Exercises Total Joint Exercises Ankle Circles/Pumps: AROM;Both;20 reps;Supine Quad Sets: AROM;Both;10 reps;Supine Heel Slides: AAROM;Left;20 reps;Supine Hip ABduction/ADduction: AAROM;Left;15 reps;Supine    General Comments        Pertinent Vitals/Pain Pain Assessment: 0-10 Pain Score: 5  Pain Location: Lt and Rt hip Pain Descriptors / Indicators: Sore;Tender;Discomfort Pain Intervention(s): Limited activity within patient's tolerance;Monitored during session;Premedicated before session    Home Living                      Prior Function  PT Goals (current goals can now be found in the care plan section) Acute Rehab PT Goals Patient Stated Goal: Be independent and have less pain PT Goal Formulation: With patient Time For Goal Achievement: 07/16/21 Potential to Achieve Goals: Good Progress towards PT goals: Progressing toward goals    Frequency    7X/week      PT Plan Current plan remains appropriate    Co-evaluation               AM-PAC PT "6 Clicks" Mobility   Outcome Measure  Help needed turning from your back to your side while in a flat bed without using bedrails?: A Lot Help needed moving from lying on your back to sitting on the side of a flat bed without using bedrails?: A Lot Help needed moving to and from a bed to a chair (including a wheelchair)?: A Lot Help needed standing up from a chair using your arms (e.g., wheelchair or bedside chair)?: A Lot Help needed to walk in hospital room?: A Lot Help needed climbing 3-5 steps with a railing? : Total 6 Click Score: 11    End of Session Equipment Utilized During Treatment: Gait belt Activity Tolerance: Patient limited by fatigue;Patient limited by pain;Patient tolerated treatment well Patient left: in chair;with call bell/phone within reach;with chair alarm set Nurse Communication: Mobility status PT Visit Diagnosis: Muscle weakness (generalized) (M62.81);Pain;Difficulty in walking, not elsewhere classified (R26.2) Pain - Right/Left: Left Pain - part of body: Hip     Time: [id number] PT Time Calculation (min) (ACUTE ONLY): 43 min  Charges:  $Gait Training: 8-22 mins $Therapeutic Exercise: 8-22 mins $Therapeutic Activity: 8-22 mins                     Debe Coder PT Acute Rehabilitation Services Pager [phone] Office [phone]    Lorra Freeman 07/05/2021, 10:15 AM

## 2021-07-06 LAB — CBC
HCT: 22.2 % — ABNORMAL LOW (ref 36.0–46.0)
Hemoglobin: 7.5 g/dL — ABNORMAL LOW (ref 12.0–15.0)
MCH: 28.6 pg (ref 26.0–34.0)
MCHC: 33.8 g/dL (ref 30.0–36.0)
MCV: 84.7 fL (ref 80.0–100.0)
Platelets: 219 10*3/uL (ref 150–400)
RBC: 2.62 MIL/uL — ABNORMAL LOW (ref 3.87–5.11)
RDW: 14.3 % (ref 11.5–15.5)
WBC: 9.4 10*3/uL (ref 4.0–10.5)
nRBC: 0.2 % (ref 0.0–0.2)

## 2021-07-06 LAB — HEMOGLOBIN AND HEMATOCRIT, BLOOD
HCT: 24.4 % — ABNORMAL LOW (ref 36.0–46.0)
Hemoglobin: 8.3 g/dL — ABNORMAL LOW (ref 12.0–15.0)

## 2021-07-06 NOTE — Plan of Care (Signed)
  Problem: Clinical Measurements: Goal: Will remain free from infection Outcome: Progressing   Problem: Activity: Goal: Risk for activity intolerance will decrease Outcome: Progressing   Problem: Pain Managment: Goal: General experience of comfort will improve Outcome: Progressing   

## 2021-07-06 NOTE — Progress Notes (Signed)
Physical Therapy Treatment Patient Details Name: Evelyn Butler [facility] MRN: [id number] DOB: 09/16/1966 Today's Date: 07/06/2021    History of Present Illness Pt is a 55 y.o. female s/p Lt THA posterior approach. PMH significant for HTN and  pre-diabetes.    PT Comments    Pt fatigued this pm after up to bathroom for toileting and bathing with CNA.  Pt willing to perform therex program "and anything else I need to".    Follow Up Recommendations  Follow surgeon's recommendation for DC plan and follow-up therapies     Equipment Recommendations  Rolling walker with 5" wheels;3in1 (PT)    Recommendations for Other Services OT consult     Precautions / Restrictions Precautions Precautions: Posterior Hip Precaution Comments: Pt recalls 3/3 THP without cues.  All precautions reviewed Restrictions Weight Bearing Restrictions: No RLE Weight Bearing: Weight bearing as tolerated    Mobility  Bed Mobility               General bed mobility comments: Pt up in chair and requests back to same    Transfers                    Ambulation/Gait                 Stairs             Wheelchair Mobility    Modified Rankin (Stroke Patients Only)       Balance                                            Cognition Arousal/Alertness: Awake/alert Behavior During Therapy: WFL for tasks assessed/performed Overall Cognitive Status: Within Functional Limits for tasks assessed                                        Exercises Total Joint Exercises Ankle Circles/Pumps: AROM;Both;20 reps;Supine Quad Sets: AROM;Both;10 reps;Supine Heel Slides: AAROM;Left;20 reps;Supine Hip ABduction/ADduction: AAROM;Left;Supine;20 reps Long Arc Quad: AROM;15 reps;Both;Seated    General Comments        Pertinent Vitals/Pain Pain Assessment: 0-10 Pain Score: 4  Pain Location: Lt and Rt hip Pain Descriptors / Indicators:  Sore;Tender;Discomfort Pain Intervention(s): Limited activity within patient's tolerance;Monitored during session;Premedicated before session    Home Living                      Prior Function            PT Goals (current goals can now be found in the care plan section) Acute Rehab PT Goals Patient Stated Goal: Be independent and have less pain PT Goal Formulation: With patient Time For Goal Achievement: 07/16/21 Potential to Achieve Goals: Good Progress towards PT goals: Progressing toward goals    Frequency    7X/week      PT Plan Current plan remains appropriate    Co-evaluation              AM-PAC PT "6 Clicks" Mobility   Outcome Measure  Help needed turning from your back to your side while in a flat bed without using bedrails?: A Lot Help needed moving from lying on your back to sitting on the side of a flat bed without using bedrails?: A Lot Help needed moving  to and from a bed to a chair (including a wheelchair)?: A Lot Help needed standing up from a chair using your arms (e.g., wheelchair or bedside chair)?: A Little Help needed to walk in hospital room?: A Little Help needed climbing 3-5 steps with a railing? : Total 6 Click Score: 13    End of Session   Activity Tolerance: Patient tolerated treatment well Patient left: in chair;with call bell/phone within reach Nurse Communication: Mobility status PT Visit Diagnosis: Muscle weakness (generalized) (M62.81);Pain;Difficulty in walking, not elsewhere classified (R26.2) Pain - Right/Left: Left Pain - part of body: Hip     Time: [id number] PT Time Calculation (min) (ACUTE ONLY): 24 min  Charges:  $Therapeutic Exercise: 8-22 mins                     Debe Coder PT Acute Rehabilitation Services Pager [phone] Office [phone]    Haydee Jabbour 07/06/2021, 5:24 PM

## 2021-07-06 NOTE — Plan of Care (Signed)
  Problem: Activity: Goal: Risk for activity intolerance will decrease Outcome: Progressing   Problem: Pain Managment: Goal: General experience of comfort will improve Outcome: Progressing   Problem: Safety: Goal: Ability to remain free from injury will improve Outcome: Progressing   

## 2021-07-06 NOTE — Progress Notes (Signed)
Physical Therapy Treatment Patient Details Name: Evelyn Butler [facility] MRN: [id number] DOB: [birth year] Today's Date: 07/06/2021    History of Present Illness Pt is a 55 y.o. female s/p Lt THA posterior approach. PMH significant for HTN and  pre-diabetes.    PT Comments    Pt very motivated to progress and up from [facility] with very min assist to ambulate increased distance in hall - pt requiring increased time and multiple standing rest breaks but able to ambulate 101' with RW   Follow Up Recommendations  Follow surgeon's recommendation for DC plan and follow-up therapies     Equipment Recommendations  Rolling walker with 5" wheels;3in1 (PT)    Recommendations for Other Services OT consult     Precautions / Restrictions Precautions Precautions: Posterior Hip Precaution Comments: Pt recalls 3/3 THP without cues.  All precautions reviewed Restrictions Weight Bearing Restrictions: No    Mobility  Bed Mobility               General bed mobility comments: Pt on BSC at session beginning and up in chair at session end    Transfers Overall transfer level: Needs assistance Equipment used: Rolling walker (2 wheeled) Transfers: Sit to/from Stand Sit to Stand: Min assist;Min guard         General transfer comment: cues for LE management and use of UEs to self assist  Ambulation/Gait Ambulation/Gait assistance: Min assist;Min guard Gait Distance (Feet): 46 Feet Assistive device: Rolling walker (2 wheeled) Gait Pattern/deviations: Step-to pattern;Decreased step length - right;Decreased step length - left;Shuffle;Trunk flexed Gait velocity: decr   General Gait Details: Increased time with multiple standing rest breaks to complete task.  Cues for posture, position from RW and ER on L   Stairs             Wheelchair Mobility    Modified Rankin (Stroke Patients Only)       Balance Overall balance assessment: Needs assistance Sitting-balance support: Feet  supported;No upper extremity supported Sitting balance-Leahy Scale: Good     Standing balance support: No upper extremity supported Standing balance-Leahy Scale: Fair                              Cognition Arousal/Alertness: Awake/alert Behavior During Therapy: WFL for tasks assessed/performed Overall Cognitive Status: Within Functional Limits for tasks assessed                                        Exercises      General Comments        Pertinent Vitals/Pain Pain Assessment: 0-10 Pain Score: 4  Pain Location: Lt and Rt hip Pain Descriptors / Indicators: Sore;Tender;Discomfort Pain Intervention(s): Limited activity within patient's tolerance;Monitored during session;Premedicated before session;Ice applied    Home Living                      Prior Function            PT Goals (current goals can now be found in the care plan section) Acute Rehab PT Goals Patient Stated Goal: Be independent and have less pain PT Goal Formulation: With patient Time For Goal Achievement: 07/16/21 Potential to Achieve Goals: Good Progress towards PT goals: Progressing toward goals    Frequency    7X/week      PT Plan Current plan remains appropriate  Co-evaluation              AM-PAC PT "6 Clicks" Mobility   Outcome Measure  Help needed turning from your back to your side while in a flat bed without using bedrails?: A Lot Help needed moving from lying on your back to sitting on the side of a flat bed without using bedrails?: A Lot Help needed moving to and from a bed to a chair (including a wheelchair)?: A Lot Help needed standing up from a chair using your arms (e.g., wheelchair or bedside chair)?: A Little Help needed to walk in hospital room?: A Little Help needed climbing 3-5 steps with a railing? : Total 6 Click Score: 13    End of Session Equipment Utilized During Treatment: Gait belt Activity Tolerance: Patient  tolerated treatment well Patient left: in chair;with call bell/phone within reach Nurse Communication: Mobility status PT Visit Diagnosis: Muscle weakness (generalized) (M62.81);Pain;Difficulty in walking, not elsewhere classified (R26.2) Pain - Right/Left: Left Pain - part of body: Hip     Time: [id number] PT Time Calculation (min) (ACUTE ONLY): 24 min  Charges:  $Gait Training: 23-37 mins                     [place] Pager [phone] Office [phone]    Evelyn Butler 07/06/2021, 3:53 PM

## 2021-07-06 NOTE — Progress Notes (Addendum)
Physical Therapy Treatment Patient Details Name: Evelyn Butler [facility] MRN: [id number] DOB: [birth year] Today's Date: 07/06/2021    History of Present Illness Pt is a 55 y.o. female s/p Lt THA posterior approach. PMH significant for HTN and  pre-diabetes.    PT Comments    Pt very cooperative as always but emotional this am and expressing frustration with entire situation.  Pt assisted from bed to [facility] with increased time but decreased level of assist.  Follow Up Recommendations  Follow surgeon's recommendation for DC plan and follow-up therapies     Equipment Recommendations  Rolling walker with 5" wheels;3in1 (PT)    Recommendations for Other Services OT consult     Precautions / Restrictions Precautions Precautions: Posterior Hip Precaution Comments: Pt recalls 3/3 THP without cues.  All precautions reviewed Restrictions Weight Bearing Restrictions: No    Mobility  Bed Mobility Overal bed mobility: Needs Assistance Bed Mobility: Supine to Sit     Supine to sit: Min assist     General bed mobility comments: Increased time with cues for sequence.  Use of gait belt to manage R LE but min assist to manage LE and to complete rotation to EOB sitting using bed pad.    Transfers Overall transfer level: Needs assistance Equipment used: Rolling walker (2 wheeled) Transfers: Sit to/from [facility] Sit to Stand: Min assist Stand pivot transfers: Min assist       General transfer comment: cues for LE management and use of UEs to self assist. Physical assist to bring wt up and fwd and to balance in standing.  Stand/pvt with RW bed to [facility]  Ambulation/Gait             General Gait Details: To [facility] only   Stairs             Wheelchair Mobility    Modified Rankin (Stroke Patients Only)       Balance Overall balance assessment: Needs assistance Sitting-balance support: Feet supported;No upper extremity supported Sitting balance-Leahy  Scale: Good     Standing balance support: Single extremity supported Standing balance-Leahy Scale: Poor                              Cognition Arousal/Alertness: Awake/alert Behavior During Therapy: WFL for tasks assessed/performed Overall Cognitive Status: Within Functional Limits for tasks assessed                                        Exercises      General Comments        Pertinent Vitals/Pain Pain Assessment: 0-10 Pain Score: 4  Pain Location: Lt and Rt hip Pain Descriptors / Indicators: Sore;Tender;Discomfort Pain Intervention(s): Limited activity within patient's tolerance;Monitored during session;Premedicated before session    Home Living                      Prior Function            PT Goals (current goals can now be found in the care plan section) Acute Rehab PT Goals Patient Stated Goal: Be independent and have less pain PT Goal Formulation: With patient Time For Goal Achievement: 07/16/21 Potential to Achieve Goals: Good Progress towards PT goals: Progressing toward goals    Frequency    7X/week      PT Plan Current plan remains  appropriate    Co-evaluation              AM-PAC PT "6 Clicks" Mobility   Outcome Measure  Help needed turning from your back to your side while in a flat bed without using bedrails?: A Lot Help needed moving from lying on your back to sitting on the side of a flat bed without using bedrails?: A Lot Help needed moving to and from a bed to a chair (including a wheelchair)?: A Lot Help needed standing up from a chair using your arms (e.g., wheelchair or bedside chair)?: A Lot Help needed to walk in hospital room?: A Little Help needed climbing 3-5 steps with a railing? : Total 6 Click Score: 12    End of Session Equipment Utilized During Treatment: Gait belt Activity Tolerance: Patient tolerated treatment well Patient left: Other (comment) [facility]) Nurse Communication:  Mobility status PT Visit Diagnosis: Muscle weakness (generalized) (M62.81);Pain;Difficulty in walking, not elsewhere classified (R26.2) Pain - Right/Left: Left Pain - part of body: Hip     Time: [id number] PT Time Calculation (min) (ACUTE ONLY): 17 min  Charges:  $Therapeutic Activity: 8-22 mins                     Debe Coder PT Acute Rehabilitation Services Pager [phone] Office [phone]    Juanmanuel Marohl 07/06/2021, 3:44 PM

## 2021-07-06 NOTE — Progress Notes (Signed)
Subjective: 4 Days Post-Op Procedure(s) (LRB): TOTAL HIP ARTHROPLASTY-POSTERIOR (Left)  Patient reports pain as mild to moderate.  Tolerating POs.  Admits to flatus.  Denies fever, chills, N/V, CP, SOB.  Reports that she has worked well with therapy, but notes that she feels weaker today than yesterday.  She is disappointed that her Hgb isn't higher this morning.  Objective:   VITALS:  Temp:  [98.1 F (36.7 C)-98.6 F (37 C)] 98.6 F (37 C) (09/04 0421) Pulse Rate:  [100-113] 100 (09/04 0421) Resp:  [16-18] 16 (09/04 0421) BP: (111-135)/(59-82) 111/60 (09/04 0421) SpO2:  [100 %] 100 % (09/04 0421)  General: WDWN patient in NAD. Psych:  Appropriate mood and affect. Neuro:  A&O x 3, Moving all extremities, sensation intact to light touch HEENT:  EOMs intact Chest:  Even non-labored respirations Skin:  Dressing C/D/I, no rashes or lesions Extremities: warm/dry, mild edema to L hip, no erythema or echymosis.  No lymphadenopathy. Pulses: Popliteus 2+ MSK:  ROM: TKE, MMT: able to perform quad set, (-) Homan's    LABS Recent Labs    07/04/21 0446 07/04/21 1702 07/05/21 0320 07/05/21 1526 07/06/21 0319  HGB 6.4* 8.2* 7.7* 8.6* 7.5*  WBC 10.6* 11.2* 9.7 11.8* 9.4  PLT 181 175 174 213 219   Recent Labs    07/04/21 0331  NA 143  K 4.1  CL 115*  CO2 23  BUN 20  CREATININE 0.60  GLUCOSE 111*   No results for input(s): LABPT, INR in the last 72 hours.   Assessment/Plan: 4 Days Post-Op Procedure(s) (LRB): TOTAL HIP ARTHROPLASTY-POSTERIOR (Left)  Patient seen in rounds for Dr. Mellissa Kohut L LE Up with therapy Plan to repeat Hgb this afternoon.  Will transfuse if below 7.0 DVT ppx:  ASA Disp:  Home with HHPT, hopefully tomorrow.  Mechele Claude PA-C [facility] Office:  [phone]

## 2021-07-07 LAB — TYPE AND SCREEN
ABO/RH(D): A POS
Antibody Screen: NEGATIVE
Unit division: 0
Unit division: 0

## 2021-07-07 LAB — CBC
HCT: 24.6 % — ABNORMAL LOW (ref 36.0–46.0)
Hemoglobin: 8.5 g/dL — ABNORMAL LOW (ref 12.0–15.0)
MCH: 29.5 pg (ref 26.0–34.0)
MCHC: 34.6 g/dL (ref 30.0–36.0)
MCV: 85.4 fL (ref 80.0–100.0)
Platelets: 287 10*3/uL (ref 150–400)
RBC: 2.88 MIL/uL — ABNORMAL LOW (ref 3.87–5.11)
RDW: 14.2 % (ref 11.5–15.5)
WBC: 9.1 10*3/uL (ref 4.0–10.5)
nRBC: 0.3 % — ABNORMAL HIGH (ref 0.0–0.2)

## 2021-07-07 LAB — URINALYSIS, ROUTINE W REFLEX MICROSCOPIC
Bacteria, UA: NONE SEEN
Bilirubin Urine: NEGATIVE
Glucose, UA: NEGATIVE mg/dL
Hgb urine dipstick: NEGATIVE
Ketones, ur: NEGATIVE mg/dL
Leukocytes,Ua: NEGATIVE
Nitrite: NEGATIVE
Protein, ur: NEGATIVE mg/dL
Specific Gravity, Urine: 1.01 (ref 1.005–1.030)
pH: 6 (ref 5.0–8.0)

## 2021-07-07 LAB — BPAM RBC
Blood Product Expiration Date: 202209252359
Blood Product Expiration Date: 202209252359
ISSUE DATE / TIME: 202209020947
ISSUE DATE / TIME: 202209021349
Unit Type and Rh: 6200
Unit Type and Rh: 6200

## 2021-07-07 NOTE — Final Progress Note (Signed)
Patient discharged to home w/ family. Given all belongings, instructions, equipment. Verbalized understanding of all instructions. Escorted to pov via w/c. 

## 2021-07-07 NOTE — Discharge Summary (Signed)
In most cases prophylactic antibiotics for Dental procdeures after total joint surgery are not necessary.  Exceptions are as follows:  1. History of prior total joint infection  2. Severely immunocompromised (Organ Transplant, cancer chemotherapy, Rheumatoid biologic meds such as [place])  3. Poorly controlled diabetes (A1C &gt; 8.0, blood glucose over 200)  If you have one of these conditions, contact your surgeon for an antibiotic prescription, prior to your dental procedure. Orthopedic Discharge Summary        Physician Discharge Summary  Patient ID: Evelyn Butler [facility] MRN: [id number] DOB/AGE: May 11, 1966 55 y.o.  Admit date: 07/02/2021 Discharge date: 07/07/2021   Procedures:  Procedure(s) (LRB): TOTAL HIP ARTHROPLASTY-POSTERIOR (Left)  Attending Physician:  Dr. Hector Shade  Admission Diagnoses:   Left hip OA  Discharge Diagnoses:  same   Past Medical History:  Diagnosis Date   Fibroid    Hypertension    Pre-diabetes     PCP: Lin Landsman, MD   Discharged Condition: good  Hospital Course:  Patient underwent the above stated procedure on 07/02/2021. Patient tolerated the procedure well and brought to the recovery room in good condition and subsequently to the floor. Patient had an uncomplicated hospital course and was stable for discharge.   Disposition: Discharge disposition: 01-Home or Self Care      with follow up in 2 weeks    Follow-up Information     [name], MD Follow up in 2 week(s).   Specialty: Orthopedic Surgery Contact information: [address] [place] [address] [phone]         [facility], [place] Follow up.   Specialty: Home Health Services Why: HHPT Contact information: [address] [place] [address] [phone]                 Dental Antibiotics:  In most cases prophylactic antibiotics for Dental procdeures after total joint surgery are not  necessary.  Exceptions are as follows:  1. History of prior total joint infection  2. Severely immunocompromised (Organ Transplant, cancer chemotherapy, Rheumatoid biologic meds such as [place])  3. Poorly controlled diabetes (A1C &gt; 8.0, blood glucose over 200)  If you have one of these conditions, contact your surgeon for an antibiotic prescription, prior to your dental procedure.  Discharge Instructions     Call MD / Call 911   Complete by: As directed    If you experience chest pain or shortness of breath, CALL 911 and be transported to the hospital emergency room.  If you develope a fever above 101 F, pus (white drainage) or increased drainage or redness at the wound, or calf pain, call your surgeon's office.   Call MD / Call 911   Complete by: As directed    If you experience chest pain or shortness of breath, CALL 911 and be transported to the hospital emergency room.  If you develope a fever above 101 F, pus (white drainage) or increased drainage or redness at the wound, or calf pain, call your surgeon's office.   Change dressing   Complete by: As directed    You have an adhesive waterproof bandage over the incision. Leave this in place until your first follow-up appointment. Once you remove this you will not need to place another bandage.   Constipation Prevention   Complete by: As directed    Drink plenty of fluids.  Prune juice may be helpful.  You may use a stool softener, such as Colace (over the counter) 100  mg twice a day.  Use MiraLax (over the counter) for constipation as needed.   Constipation Prevention   Complete by: As directed    Drink plenty of fluids.  Prune juice may be helpful.  You may use a stool softener, such as Colace (over the counter) 100 mg twice a day.  Use MiraLax (over the counter) for constipation as needed.   Diet - low sodium heart healthy   Complete by: As directed    Diet - low sodium heart healthy   Complete by: As directed    Do not sit  on low chairs, stoools or toilet seats, as it may be difficult to get up from low surfaces   Complete by: As directed    Driving restrictions   Complete by: As directed    No driving for two weeks   Follow the hip precautions as taught in Physical Therapy   Complete by: As directed    Increase activity slowly as tolerated   Complete by: As directed    Post-operative opioid taper instructions:   Complete by: As directed    POST-OPERATIVE OPIOID TAPER INSTRUCTIONS: It is important to wean off of your opioid medication as soon as possible. If you do not need pain medication after your surgery it is ok to stop day one. Opioids include: Codeine, Hydrocodone(Norco, Vicodin), Oxycodone(Percocet, oxycontin) and hydromorphone amongst others.  Long term and even short term use of opiods can cause: Increased pain response Dependence Constipation Depression Respiratory depression And more.  Withdrawal symptoms can include Flu like symptoms Nausea, vomiting And more Techniques to manage these symptoms Hydrate well Eat regular healthy meals Stay active Use relaxation techniques(deep breathing, meditating, yoga) Do Not substitute Alcohol to help with tapering If you have been on opioids for less than two weeks and do not have pain than it is ok to stop all together.  Plan to wean off of opioids This plan should start within one week post op of your joint replacement. Maintain the same interval or time between taking each dose and first decrease the dose.  Cut the total daily intake of opioids by one tablet each day Next start to increase the time between doses. The last dose that should be eliminated is the evening dose.      Post-operative opioid taper instructions:   Complete by: As directed    POST-OPERATIVE OPIOID TAPER INSTRUCTIONS: It is important to wean off of your opioid medication as soon as possible. If you do not need pain medication after your surgery it is ok to stop day  one. Opioids include: Codeine, Hydrocodone(Norco, Vicodin), Oxycodone(Percocet, oxycontin) and hydromorphone amongst others.  Long term and even short term use of opiods can cause: Increased pain response Dependence Constipation Depression Respiratory depression And more.  Withdrawal symptoms can include Flu like symptoms Nausea, vomiting And more Techniques to manage these symptoms Hydrate well Eat regular healthy meals Stay active Use relaxation techniques(deep breathing, meditating, yoga) Do Not substitute Alcohol to help with tapering If you have been on opioids for less than two weeks and do not have pain than it is ok to stop all together.  Plan to wean off of opioids This plan should start within one week post op of your joint replacement. Maintain the same interval or time between taking each dose and first decrease the dose.  Cut the total daily intake of opioids by one tablet each day Next start to increase the time between doses. The last dose that should  be eliminated is the evening dose.      TED hose   Complete by: As directed    Use stockings (TED hose) for three weeks on both leg(s).  You may remove them at night for sleeping.   Weight bearing as tolerated   Complete by: As directed        Allergies as of 07/07/2021       Reactions   Codeine Anaphylaxis, Shortness Of Breath, Nausea And Vomiting   Closes throat    Gluten Meal    Angioedema and joint swelling   Monosodium Glutamate Swelling   MSG    Sulfa Antibiotics Nausea And Vomiting   Migraines   Levofloxacin    Facial Swelling   Other Rash   Mesh bed padding         Medication List     STOP taking these medications    meloxicam 7.5 MG tablet Commonly known as: MOBIC       TAKE these medications    ADULT GUMMY PO Take 2 capsules by mouth daily.   aspirin 325 MG EC tablet Take 1 tablet (325 mg total) by mouth 2 (two) times daily. Take until prescription is finished. Then take  '81mg'$  Aspirin once daily for three weeks. Then discontinue Aspirin.   Dialyvite Vitamin D 5000 125 MCG (5000 UT) capsule Generic drug: Cholecalciferol Take 15,000 Units by mouth daily.   hydrochlorothiazide 12.5 MG capsule Commonly known as: MICROZIDE Take 12.5 mg by mouth daily.   HYDROmorphone 2 MG tablet Commonly known as: DILAUDID Take 1-2 tablets (2-4 mg total) by mouth every 6 (six) hours as needed for severe pain.   lisinopril 10 MG tablet Commonly known as: ZESTRIL Take 10 mg by mouth daily.   methocarbamol 500 MG tablet Commonly known as: ROBAXIN Take 1 tablet (500 mg total) by mouth every 6 (six) hours as needed for muscle spasms.   traMADol 50 MG tablet Commonly known as: ULTRAM Take 1-2 tablets (50-100 mg total) by mouth every 6 (six) hours as needed for moderate pain. What changed:  how much to take when to take this reasons to take this               Durable Medical Equipment  (From admission, onward)           Start     Ordered   07/05/21 1439  For home use only DME Walker wide  Once       Question:  Patient needs a walker to treat with the following condition  Answer:  Aftercare following hip joint replacement surgery, unspecified laterality   07/05/21 1439   07/04/21 1538  For home use only DME Walker youth  Once       Question:  Patient needs a walker to treat with the following condition  Answer:  Status post left hip replacement   07/04/21 1538   07/04/21 1536  For home use only DME Walker youth  Once       Question:  Patient needs a walker to treat with the following condition  Answer:  Primary osteoarthritis of left hip   07/04/21 1535   07/04/21 1536  For home use only DME 3 n 1  Once        07/04/21 1536              Discharge Care Instructions  (From admission, onward)           Start     Ordered  07/05/21 0000  Weight bearing as tolerated        07/05/21 1032   07/05/21 0000  Change dressing       Comments: You have  an adhesive waterproof bandage over the incision. Leave this in place until your first follow-up appointment. Once you remove this you will not need to place another bandage.   07/05/21 1032              Signed: Augustin Schooling 07/07/2021, 8:01 AM  [facility] is now [facility] [address], [place], [place], [place] [address] Phone: [place]

## 2021-07-07 NOTE — Progress Notes (Signed)
Occupational Therapy Treatment Patient Details Name: Evelyn Butler [facility] MRN: [id number] DOB: 02/12/1966 Today's Date: 07/07/2021    History of present illness Pt is a 55 y.o. female s/p Lt THA posterior approach. PMH significant for HTN and  pre-diabetes.   OT comments  Patient was educated on LB Dressing with patient able to verbalize and demonstrate understanding with teach back. Patient plans to transition home today with aid and husband support. Patient's discharge plan remains appropriate at this time. OT will continue to follow acutely.     Follow Up Recommendations  Home health OT;Supervision/Assistance - 24 hour    Equipment Recommendations  Other (comment)    Recommendations for Other Services      Precautions / Restrictions Precautions Precautions: Posterior Hip Precaution Booklet Issued: Yes (comment) Precaution Comments: Pt recalls 3/3 THP without cues.  All precautions reviewed Restrictions Weight Bearing Restrictions: No Other Position/Activity Restrictions: WBAT       Mobility Bed Mobility               General bed mobility comments: Pt up in chair and requests back to same    Transfers Overall transfer level: Needs assistance Equipment used: Rolling walker (2 wheeled) Transfers: Sit to/from Stand Sit to Stand: Min guard         General transfer comment: cues for LE management and use of UEs to self assist    Balance                                           ADL either performed or assessed with clinical judgement   ADL                       Lower Body Dressing: Min guard Lower Body Dressing Details (indicate cue type and reason): patient was educated with total hip kit on how to don/doff pants, underwear, socks and shoes while maintaining total hip precautions. patient verbalized and demonstrated understanding with AE seated in recliner on this date. patient reported she was ordering total hip kit.    Toilet Transfer Details (indicate cue type and reason): patient was set up for transfer from recliner in room to 3 in 1 commode with RW and encouragement to be as independent as possible with task. Toileting- Clothing Manipulation and Hygiene: Supervision/safety;Sit to/from stand               Vision       Perception     Praxis      Cognition Arousal/Alertness: Awake/alert Behavior During Therapy: WFL for tasks assessed/performed Overall Cognitive Status: Within Functional Limits for tasks assessed                                          Exercises Total Joint Exercises Ankle Circles/Pumps: AROM;Both;20 reps;Supine Quad Sets: AROM;Both;10 reps;Supine   Shoulder Instructions       General Comments      Pertinent Vitals/ Pain       Pain Assessment: Faces Pain Score: 5  Faces Pain Scale: Hurts little more Pain Location: Lt and Rt hip Pain Descriptors / Indicators: Sore;Tender;Discomfort Pain Intervention(s): Limited activity within patient's tolerance;Monitored during session  Home Living  Prior Functioning/Environment              Frequency  Min 2X/week        Progress Toward Goals  OT Goals(current goals can now be found in the care plan section)  Progress towards OT goals: Progressing toward goals  Acute Rehab OT Goals Patient Stated Goal: Be independent and have less pain  Plan Discharge plan remains appropriate    Co-evaluation                 AM-PAC OT "6 Clicks" Daily Activity     Outcome Measure   Help from another person eating meals?: None Help from another person taking care of personal grooming?: A Little Help from another person toileting, which includes using toliet, bedpan, or urinal?: A Little Help from another person bathing (including washing, rinsing, drying)?: A Lot Help from another person to put on and taking off regular upper body  clothing?: A Little Help from another person to put on and taking off regular lower body clothing?: A Little 6 Click Score: 18    End of Session    OT Visit Diagnosis: Other abnormalities of gait and mobility (R26.89)   Activity Tolerance Patient tolerated treatment well   Patient Left in chair;with call bell/phone within reach;with chair alarm set   Nurse Communication Mobility status        Time: 9381-8299 OT Time Calculation (min): 35 min  Charges: OT General Charges $OT Visit: 1 Visit OT Treatments $Self Care/Home Management : 23-37 mins  Jackelyn Poling OTR/L, MS Acute Rehabilitation Department Office# [phone] Pager# [place] 07/07/2021, 12:44 PM

## 2021-07-07 NOTE — Progress Notes (Signed)
Orthopedics Progress Note  Subjective: Comfortable this AM. Did well with PT  Objective:  Vitals:   07/06/21 2051 07/07/21 0619  BP: 135/79 105/62  Pulse: (!) 104 98  Resp: 18 18  Temp: 99.5 F (37.5 C) 98.7 F (37.1 C)  SpO2: 100% 100%    General: Awake and alert  Musculoskeletal: left hip dressing CDI. Neg [place] Compartments supple Neurovascularly intact  Lab Results  Component Value Date   WBC 9.1 07/07/2021   HGB 8.5 (L) 07/07/2021   HCT 24.6 (L) 07/07/2021   MCV 85.4 07/07/2021   PLT 287 07/07/2021       Component Value Date/Time   NA 143 07/04/2021 0331   K 4.1 07/04/2021 0331   CL 115 (H) 07/04/2021 0331   CO2 23 07/04/2021 0331   GLUCOSE 111 (H) 07/04/2021 0331   BUN 20 07/04/2021 0331   CREATININE 0.60 07/04/2021 0331   CALCIUM 8.7 (L) 07/04/2021 0331   GFRNONAA >60 07/04/2021 0331    Lab Results  Component Value Date   INR 1.0 06/23/2021    Assessment/Plan: s/p Procedure(s): TOTAL HIP ARTHROPLASTY-POSTERIOR D/C to home after therapy today Follow up with ortho in two weeks in the office  Remo Lipps R. Veverly Fells, MD 07/07/2021 7:59 AM

## 2021-07-07 NOTE — Progress Notes (Signed)
Physical Therapy Treatment Patient Details Name: Evelyn Butler [facility] MRN: [id number] DOB: 1966-05-27 Today's Date: 07/07/2021    History of Present Illness Pt is a 55 y.o. female s/p Lt THA posterior approach. PMH significant for HTN and  pre-diabetes.    PT Comments    Pt  progressing, some  limitations d/t fatigue. Reviewed posterior THP, and answered pt questions/concerns regarding mobility/precautions/car transfers. Has scooter at home and had limitations prior to surgery d/t hip pain so anticipate pt nearing baseline. Ready to d/c from PT standpoint with family assist as needed.  Follow Up Recommendations  Follow surgeon's recommendation for DC plan and follow-up therapies     Equipment Recommendations  Rolling walker with 5" wheels;3in1 (PT)    Recommendations for Other Services OT consult     Precautions / Restrictions Precautions Precautions: Posterior Hip Precaution Comments: Pt recalls 3/3 THP without cues.  All precautions reviewed Restrictions Weight Bearing Restrictions: No    Mobility  Bed Mobility               General bed mobility comments: Pt up in chair and requests back to same    Transfers Overall transfer level: Needs assistance Equipment used: Rolling walker (2 wheeled) Transfers: Sit to/from Stand Sit to Stand: Min guard         General transfer comment: cues for LE management and use of UEs to self assist  Ambulation/Gait Ambulation/Gait assistance: Supervision Gait Distance (Feet): 35 Feet Assistive device: Rolling walker (2 wheeled) Gait Pattern/deviations: Step-to pattern;Decreased step length - left;Decreased step length - right;Trunk flexed;Wide base of support     General Gait Details: Increased time with multiple standing rest breaks to complete task.  Cues for posture, position from RW and ER on L   Stairs             Wheelchair Mobility    Modified Rankin (Stroke Patients Only)       Balance                                             Cognition Arousal/Alertness: Awake/alert Behavior During Therapy: WFL for tasks assessed/performed Overall Cognitive Status: Within Functional Limits for tasks assessed                                        Exercises Total Joint Exercises Ankle Circles/Pumps: AROM;Both;20 reps;Supine Quad Sets: AROM;Both;10 reps;Supine    General Comments        Pertinent Vitals/Pain Pain Assessment: 0-10 Pain Score: 5  Pain Location: Lt and Rt hip Pain Descriptors / Indicators: Sore;Tender;Discomfort Pain Intervention(s): Limited activity within patient's tolerance;Premedicated before session;Monitored during session;Repositioned    Home Living                      Prior Function            PT Goals (current goals can now be found in the care plan section) Acute Rehab PT Goals Patient Stated Goal: Be independent and have less pain PT Goal Formulation: With patient Time For Goal Achievement: 07/16/21 Potential to Achieve Goals: Good Progress towards PT goals: Progressing toward goals    Frequency    7X/week      PT Plan Current plan remains appropriate    Co-evaluation  AM-PAC PT "6 Clicks" Mobility   Outcome Measure  Help needed turning from your back to your side while in a flat bed without using bedrails?: A Lot Help needed moving from lying on your back to sitting on the side of a flat bed without using bedrails?: A Lot Help needed moving to and from a bed to a chair (including a wheelchair)?: A Lot Help needed standing up from a chair using your arms (e.g., wheelchair or bedside chair)?: A Little Help needed to walk in hospital room?: A Little Help needed climbing 3-5 steps with a railing? : Total 6 Click Score: 13    End of Session   Activity Tolerance: Patient tolerated treatment well Patient left: in chair;with call bell/phone within reach;with chair alarm set;Other  (comment) (OT in room) Nurse Communication: Mobility status PT Visit Diagnosis: Muscle weakness (generalized) (M62.81);Pain;Difficulty in walking, not elsewhere classified (R26.2) Pain - Right/Left: Left Pain - part of body: Hip     Time: 1130-1203 PT Time Calculation (min) (ACUTE ONLY): 33 min  Charges:  $Gait Training: 8-22 mins $Therapeutic Activity: 8-22 mins                     Baxter Flattery, PT  Acute Rehab Dept ([place]) [phone] Pager [phone]  07/07/2021    [facility] 07/07/2021, 12:16 PM

## 2021-07-25 ENCOUNTER — Encounter (HOSPITAL_COMMUNITY): Payer: Self-pay | Admitting: Orthopedic Surgery

## 2022-06-17 NOTE — Progress Notes (Signed)
COVID Vaccine received:  '[]'$  No '[x]'$  Yes Date of any COVID positive Test in last 90 days:  PCP - Lin Landsman, MD  Medical clearance on chart Cardiologist -   Chest x-ray -  EKG -  07-02-21  Epic    will repeat at PST Stress Test -  ECHO -  Cardiac Cath -   Pacemaker/ICD device     '[x]'$  N/A Spinal Cord Stimulator:'[x]'$  No '[]'$  Yes   Other Implants:   History of Sleep Apnea? '[x]'$  No '[]'$  Yes   Sleep Study Date:   CPAP used?- '[x]'$  No '[]'$  Yes  (Instruct to bring their mask & Tubing)  Does the patient monitor blood sugar? '[]'$  No '[]'$  Yes  '[]'$  N/A Does patient have a [employer] or Dexacom? '[]'$  No '[]'$  Yes   Fasting Blood Sugar Ranges-  Checks Blood Sugar _____ times a day  Blood Thinner Instructions: Aspirin Instructions: Last Dose:  ERAS Protocol Ordered: '[]'$  No  '[x]'$  Yes PRE-SURGERY '[]'$  ENSURE  '[x]'$  G2   Comments:   Activity level: Patient can / can not climb a flight of stairs without difficulty;  '[]'$  No CP  '[]'$  No SOB,  but would have ______   Anesthesia review: chronic Anemia  Patient denies shortness of breath, fever, cough and chest pain at PAT appointment.  Patient verbalized understanding and agreement to the Pre-Surgical Instructions that were given to them at this PAT appointment. Patient was also educated of the need to review these PAT instructions again prior to his/her surgery.I reviewed the appropriate phone numbers to call if they have any and questions or concerns.

## 2022-06-17 NOTE — Patient Instructions (Signed)
DUE TO SPACE LIMITATIONS, ONLY TWO VISITORS  (aged 55 and older) ARE ALLOWED TO COME WITH YOU AND STAY IN THE WAITING ROOM DURING YOUR PRE OP AND PROCEDURE.   **NO VISITORS ARE ALLOWED IN THE SHORT STAY AREA OR RECOVERY ROOM!!**  IF YOU WILL BE ADMITTED INTO THE HOSPITAL YOU ARE ALLOWED ONLY FOUR SUPPORT PEOPLE DURING VISITATION HOURS (7 AM -8PM)   The support person(s) must pass our screening, and use Hand sanitizing gel. Visitors GUEST BADGE MUST BE WORN VISIBLY  One adult visitor may remain with you overnight and MUST be in the room by 8 P.M.   You are not required to quarantine at this time prior to your surgery. However, you must do this: Hand Hygiene often Do NOT share personal items Notify your provider if you are in close contact with someone who has COVID or you develop fever 100.4 or greater, new onset of sneezing, cough, sore throat, shortness of breath or body aches.       Your procedure is scheduled on:  Wednesday  July 01, 2022  Report to [facility] Main Entrance.  Report to admitting at:  06:45   AM  +++++Call this number if you have any questions or problems the morning of surgery [phone]  Do not eat food :After Midnight the night prior to your surgery/procedure.  After Midnight you may have the following liquids until  06:15 AM DAY OF SURGERY  Clear Liquid Diet Water Black Coffee (sugar ok, NO MILK/CREAM OR CREAMERS)  Tea (sugar ok, NO MILK/CREAM OR CREAMERS) regular and decaf                             Plain Jell-O (NO RED)                                           Fruit ices (not with fruit pulp, NO RED)                                     Popsicles (NO RED)                                                                  Juice: apple, WHITE grape, WHITE cranberry Sports drinks like Gatorade (NO RED)                    The day of surgery:  Drink ONE (1) Pre-Surgery G2 at 06:15 AM the morning of surgery. Drink in one sitting. Do not sip.   This drink was given to you during your hospital pre-op appointment visit. Nothing else to drink after completing the Pre-Surgery or G2.    FOLLOW ANY ADDITIONAL PRE OP INSTRUCTIONS YOU RECEIVED FROM YOUR SURGEON'S OFFICE!!!   Oral Hygiene is also important to reduce your risk of infection.        Remember - BRUSH YOUR TEETH THE MORNING OF SURGERY WITH YOUR REGULAR TOOTHPASTE  Do NOT smoke after Midnight the night before surgery.  Take ONLY these medicines  the morning of surgery with A SIP OF WATER: Tramadol if you need it for pain.   Do Not inject your OZEMPIC this week.    Bring CPAP mask and tubing day of surgery.                   You may not have any metal on your body including hair pins, jewelry, and body piercing  Do not wear make-up, lotions, powders, perfumes, or deodorant  Do not wear nail polish including gel and S&S, artificial / acrylic nails, or any other type of covering on natural nails including finger and toenails. If you have artificial nails, gel coating, etc., that needs to be removed by a nail salon, Please have this removed prior to surgery. Not doing so may mean that your surgery could be cancelled or delayed if the Surgeon or anesthesia staff feels like they are unable to monitor you safely.   Do not shave 48 hours prior to surgery to avoid nicks in your skin which may contribute to postoperative infections.   Contacts, Hearing Aids, dentures or bridgework may not be worn into surgery.   You may bring a small overnight bag with you on the day of surgery, only pack items that are not valuable .[facility] IS NOT RESPONSIBLE   FOR VALUABLES THAT ARE LOST OR STOLEN.   DO NOT [place]. PHARMACY WILL DISPENSE MEDICATIONS LISTED ON YOUR MEDICATION LIST TO YOU DURING YOUR ADMISSION [place]!    Special Instructions: Bring a copy of your healthcare power of attorney and living will documents the day of surgery, if you  wish to have them scanned into your [facility] Records- EPIC  Please read over the following fact sheets you were given: IF YOU HAVE QUESTIONS ABOUT YOUR PRE-OP INSTRUCTIONS, PLEASE CALL [phone]  ([place])   [facility] - Preparing for Surgery Before surgery, you can play an important role.  Because skin is not sterile, your skin needs to be as free of germs as possible.  You can reduce the number of germs on your skin by washing with CHG (chlorahexidine gluconate) soap before surgery.  CHG is an antiseptic cleaner which kills germs and bonds with the skin to continue killing germs even after washing. Please DO NOT use if you have an allergy to CHG or antibacterial soaps.  If your skin becomes reddened/irritated stop using the CHG and inform your nurse when you arrive at Short Stay. Do not shave (including legs and underarms) for at least 48 hours prior to the first CHG shower.  You may shave your face/neck.  Please follow these instructions carefully:  1.  Shower with CHG Soap the night before surgery and the  morning of surgery.  2.  If you choose to wash your hair, wash your hair first as usual with your normal  shampoo.  3.  After you shampoo, rinse your hair and body thoroughly to remove the shampoo.                             4.  Use CHG as you would any other liquid soap.  You can apply chg directly to the skin and wash.  Gently with a scrungie or clean washcloth.  5.  Apply the CHG Soap to your body ONLY FROM THE NECK DOWN.   Do not use on face/ open  Wound or open sores. Avoid contact with eyes, ears mouth and genitals (private parts).                       Wash face,  Genitals (private parts) with your normal soap.             6.  Wash thoroughly, paying special attention to the area where your  surgery  will be performed.  7.  Thoroughly rinse your body with warm water from the neck down.  8.  DO NOT shower/wash with your normal soap after using and  rinsing off the CHG Soap.            9.  Pat yourself dry with a clean towel.            10.  Wear clean pajamas.            11.  Place clean sheets on your bed the night of your first shower and do not  sleep with pets.  ON THE DAY OF SURGERY : Do not apply any lotions/deodorants the morning of surgery.  Please wear clean clothes to the hospital/surgery center.    FAILURE TO FOLLOW THESE INSTRUCTIONS MAY RESULT IN THE CANCELLATION OF YOUR SURGERY  PATIENT SIGNATURE_________________________________  NURSE SIGNATURE__________________________________  ________________________________________________________________________    Adam Phenix    An incentive spirometer is a tool that can help keep your lungs clear and active. This tool measures how well you are filling your lungs with each breath. Taking long deep breaths may help reverse or decrease the chance of developing breathing (pulmonary) problems (especially infection) following: A long period of time when you are unable to move or be active. BEFORE THE PROCEDURE  If the spirometer includes an indicator to show your best effort, your nurse or respiratory therapist will set it to a desired goal. If possible, sit up straight or lean slightly forward. Try not to slouch. Hold the incentive spirometer in an upright position. INSTRUCTIONS FOR USE  Sit on the edge of your bed if possible, or sit up as far as you can in bed or on a chair. Hold the incentive spirometer in an upright position. Breathe out normally. Place the mouthpiece in your mouth and seal your lips tightly around it. Breathe in slowly and as deeply as possible, raising the piston or the ball toward the top of the column. Hold your breath for 3-5 seconds or for as long as possible. Allow the piston or ball to fall to the bottom of the column. Remove the mouthpiece from your mouth and breathe out normally. Rest for a few seconds and repeat Steps 1 through 7 at  least 10 times every 1-2 hours when you are awake. Take your time and take a few normal breaths between deep breaths. The spirometer may include an indicator to show your best effort. Use the indicator as a goal to work toward during each repetition. After each set of 10 deep breaths, practice coughing to be sure your lungs are clear. If you have an incision (the cut made at the time of surgery), support your incision when coughing by placing a pillow or rolled up towels firmly against it. Once you are able to get out of bed, walk around indoors and cough well. You may stop using the incentive spirometer when instructed by your caregiver.  RISKS AND COMPLICATIONS Take your time so you do not get dizzy or light-headed. If you are in pain, you  may need to take or ask for pain medication before doing incentive spirometry. It is harder to take a deep breath if you are having pain. AFTER USE Rest and breathe slowly and easily. It can be helpful to keep track of a log of your progress. Your caregiver can provide you with a simple table to help with this. If you are using the spirometer at home, follow these instructions: [place] IF:  You are having difficultly using the spirometer. You have trouble using the spirometer as often as instructed. Your pain medication is not giving enough relief while using the spirometer. You develop fever of 100.5 F (38.1 C) or higher.                                                                                                    SEEK IMMEDIATE MEDICAL CARE IF:  You cough up bloody sputum that had not been present before. You develop fever of 102 F (38.9 C) or greater. You develop worsening pain at or near the incision site. MAKE SURE YOU:  Understand these instructions. Will watch your condition. Will get help right away if you are not doing well or get worse. Document Released: 03/01/2007 Document Revised: 01/11/2012 Document Reviewed:  05/02/2007 [facility] Patient Information 2014 [place], [place].

## 2022-06-19 ENCOUNTER — Encounter (HOSPITAL_COMMUNITY)
Admission: RE | Admit: 2022-06-19 | Discharge: 2022-06-19 | Disposition: A | Discharge status: Home / Self Care | Payer: BC Managed Care – PPO | Source: Ambulatory Visit | Attending: Anesthesiology | Admitting: Anesthesiology

## 2022-06-19 DIAGNOSIS — I10 Essential (primary) hypertension: Secondary | ICD-10-CM

## 2022-06-19 DIAGNOSIS — Z01818 Encounter for other preprocedural examination: Secondary | ICD-10-CM

## 2022-06-19 DIAGNOSIS — R7303 Prediabetes: Secondary | ICD-10-CM

## 2022-06-23 ENCOUNTER — Encounter (HOSPITAL_COMMUNITY)
Admission: RE | Admit: 2022-06-23 | Discharge: 2022-06-23 | Disposition: A | Discharge status: Home / Self Care | Payer: BC Managed Care – PPO | Source: Ambulatory Visit | Attending: Orthopedic Surgery | Admitting: Orthopedic Surgery

## 2022-06-23 ENCOUNTER — Encounter (HOSPITAL_COMMUNITY): Payer: Self-pay

## 2022-06-23 ENCOUNTER — Other Ambulatory Visit: Payer: Self-pay

## 2022-06-23 DIAGNOSIS — I498 Other specified cardiac arrhythmias: Secondary | ICD-10-CM | POA: Diagnosis not present

## 2022-06-23 DIAGNOSIS — R7303 Prediabetes: Secondary | ICD-10-CM | POA: Diagnosis not present

## 2022-06-23 DIAGNOSIS — Z01818 Encounter for other preprocedural examination: Secondary | ICD-10-CM

## 2022-06-23 DIAGNOSIS — I10 Essential (primary) hypertension: Secondary | ICD-10-CM | POA: Insufficient documentation

## 2022-06-23 HISTORY — DX: Unspecified osteoarthritis, unspecified site: M19.90

## 2022-06-23 HISTORY — DX: Depression, unspecified: F32.A

## 2022-06-23 LAB — CBC
HCT: 40.4 % (ref 36.0–46.0)
Hemoglobin: 13.3 g/dL (ref 12.0–15.0)
MCH: 29 pg (ref 26.0–34.0)
MCHC: 32.9 g/dL (ref 30.0–36.0)
MCV: 88 fL (ref 80.0–100.0)
Platelets: 320 10*3/uL (ref 150–400)
RBC: 4.59 MIL/uL (ref 3.87–5.11)
RDW: 13.4 % (ref 11.5–15.5)
WBC: 4.9 10*3/uL (ref 4.0–10.5)
nRBC: 0 % (ref 0.0–0.2)

## 2022-06-23 LAB — BASIC METABOLIC PANEL
Anion gap: 6 (ref 5–15)
BUN: 30 mg/dL — ABNORMAL HIGH (ref 6–20)
CO2: 26 mmol/L (ref 22–32)
Calcium: 9.7 mg/dL (ref 8.9–10.3)
Chloride: 105 mmol/L (ref 98–111)
Creatinine, Ser: 1.05 mg/dL — ABNORMAL HIGH (ref 0.44–1.00)
GFR, Estimated: >60 mL/min (ref >60)
Glucose, Bld: 85 mg/dL (ref 70–99)
Potassium: 4.1 mmol/L (ref 3.5–5.1)
Sodium: 137 mmol/L (ref 135–145)

## 2022-06-23 LAB — HEMOGLOBIN A1C
Hgb A1c MFr Bld: 4.6 % — ABNORMAL LOW (ref 4.8–5.6)
Mean Plasma Glucose: 85.32 mg/dL

## 2022-06-23 LAB — SURGICAL PCR SCREEN
MRSA, PCR: NEGATIVE
Staphylococcus aureus: NEGATIVE

## 2022-06-23 NOTE — Progress Notes (Signed)
COVID Vaccine received:  '[]'$  No '[x]'$  Yes Date of any COVID positive Test in last 90 days: none   PCP - Lin Landsman, MD  Medical clearance on chart Cardiologist - None   Chest x-ray -  EKG -  07-02-21  Epic    will repeat at PST Stress Test -  ECHO -  Cardiac Cath -    Pacemaker/ICD device     '[x]'$  N/A Spinal Cord Stimulator:'[x]'$  No '[]'$  Yes   Other Implants:    History of Sleep Apnea? '[x]'$  No '[]'$  Yes   Sleep Study Date:   CPAP used?- '[x]'$  No '[]'$  Yes  (Instruct to bring their mask & Tubing)   Does the patient monitor blood sugar? '[x]'$  No '[]'$  Yes  '[]'$  N/A    Pre-DM - Diet controlled.     Blood Thinner Instructions: Aspirin Instructions:  ASA '81mg'$   patient sees Dr this afternoon and will ask about Holding ASA 81 mg Last Dose:   ERAS Protocol Ordered: '[]'$  No  '[x]'$  Yes PRE-SURGERY '[]'$  ENSURE  '[x]'$  G2    Comments:    Activity level: Patient can not climb a flight of stairs without difficulty;  '[x]'$  No CP  '[x]'$  No SOB,  but would have __severe leg pain__    Anesthesia review: chronic Anemia   Patient denies shortness of breath, fever, cough and chest pain at PAT appointment.   Patient verbalized understanding and agreement to the Pre-Surgical Instructions that were given to them at this PAT appointment. Patient was also educated of the need to review these PAT instructions again prior to his/her surgery.I reviewed the appropriate phone numbers to call if they have any and questions or concerns.

## 2022-06-23 NOTE — Patient Instructions (Signed)
DUE TO SPACE LIMITATIONS, ONLY TWO VISITORS  (aged 55 and older) ARE ALLOWED TO COME WITH YOU AND STAY IN THE WAITING ROOM DURING YOUR PRE OP AND PROCEDURE.   **NO VISITORS ARE ALLOWED IN THE SHORT STAY AREA OR RECOVERY ROOM!!**  IF YOU WILL BE ADMITTED INTO THE HOSPITAL YOU ARE ALLOWED ONLY FOUR SUPPORT PEOPLE DURING VISITATION HOURS (7 AM -8PM)   The support person(s) must pass our screening, and use Hand sanitizing gel. Visitors GUEST BADGE MUST BE WORN VISIBLY  One adult visitor may remain with you overnight and MUST be in the room by 8 P.M.   You are not required to quarantine at this time prior to your surgery. However, you must do this: Hand Hygiene often Do NOT share personal items Notify your provider if you are in close contact with someone who has COVID or you develop fever 100.4 or greater, new onset of sneezing, cough, sore throat, shortness of breath or body aches.       Your procedure is scheduled on:  Wednesday  July 01, 2022  Report to [facility] Main Entrance.  Report to admitting at: 06:45    AM  +++++Call this number if you have any questions or problems the morning of surgery [phone]  Do not eat food :After Midnight the night prior to your surgery/procedure.  After Midnight you may have the following liquids until   06:15      AM  DAY OF SURGERY  Clear Liquid Diet Water Black Coffee (sugar ok, NO MILK/CREAM OR CREAMERS)  Tea (sugar ok, NO MILK/CREAM OR CREAMERS) regular and decaf                             Plain Jell-O (NO RED)                                           Fruit ices (not with fruit pulp, NO RED)                                     Popsicles (NO RED)                                                                  Juice: apple, WHITE grape, WHITE cranberry Sports drinks like Gatorade (NO RED)                    The day of surgery:  Drink ONE (1) Pre-Surgery G2 at  06:15 AM the morning of surgery. Drink in one sitting. Do not  sip.  This drink was given to you during your hospital pre-op appointment visit. Nothing else to drink after completing the Pre-Surgery G2.    FOLLOW ANY ADDITIONAL PRE OP INSTRUCTIONS YOU RECEIVED FROM YOUR SURGEON'S OFFICE!!!   Oral Hygiene is also important to reduce your risk of infection.        Remember - BRUSH YOUR TEETH THE MORNING OF SURGERY WITH YOUR REGULAR TOOTHPASTE    Take ONLY these medicines the morning  of surgery with A SIP OF WATER:  Tramadol,                    You may not have any metal on your body including hair pins, jewelry, and body piercing  Do not wear make-up, lotions, powders, perfumes/cologne, or deodorant  Do not wear nail polish including gel and S&S, artificial / acrylic nails, or any other type of covering on natural nails including finger and toenails. If you have artificial nails, gel coating, etc., that needs to be removed by a nail salon, Please have this removed prior to surgery. Not doing so may mean that your surgery could be cancelled or delayed if the Surgeon or anesthesia staff feels like they are unable to monitor you safely.   Do not shave 48 hours prior to surgery to avoid nicks in your skin which may contribute to postoperative infections.    You may bring a small overnight bag with you on the day of surgery, only pack items that are not valuable .[facility] IS NOT RESPONSIBLE   FOR VALUABLES THAT ARE LOST OR STOLEN.   DO NOT [place]. PHARMACY WILL DISPENSE MEDICATIONS LISTED ON YOUR MEDICATION LIST TO YOU DURING YOUR ADMISSION [place]!    Special Instructions: Bring a copy of your healthcare power of attorney and living will documents the day of surgery, if you wish to have them scanned into your [facility] Records- EPIC  Please read over the following fact sheets you were given: IF YOU HAVE QUESTIONS ABOUT YOUR PRE-OP INSTRUCTIONS, PLEASE CALL [phone]  ([place])   [facility] -  Preparing for Surgery Before surgery, you can play an important role.  Because skin is not sterile, your skin needs to be as free of germs as possible.  You can reduce the number of germs on your skin by washing with CHG (chlorahexidine gluconate) soap before surgery.  CHG is an antiseptic cleaner which kills germs and bonds with the skin to continue killing germs even after washing. Please DO NOT use if you have an allergy to CHG or antibacterial soaps.  If your skin becomes reddened/irritated stop using the CHG and inform your nurse when you arrive at Short Stay. Do not shave (including legs and underarms) for at least 48 hours prior to the first CHG shower.  You may shave your face/neck.  Please follow these instructions carefully:  1.  Shower with CHG Soap the night before surgery and the  morning of surgery.  2.  If you choose to wash your hair, wash your hair first as usual with your normal  shampoo.  3.  After you shampoo, rinse your hair and body thoroughly to remove the shampoo.                             4.  Use CHG as you would any other liquid soap.  You can apply chg directly to the skin and wash.  Gently with a scrungie or clean washcloth.  5.  Apply the CHG Soap to your body ONLY FROM THE NECK DOWN.   Do not use on face/ open                           Wound or open sores. Avoid contact with eyes, ears mouth and genitals (private parts).  Wash face,  Genitals (private parts) with your normal soap.             6.  Wash thoroughly, paying special attention to the area where your  surgery  will be performed.  7.  Thoroughly rinse your body with warm water from the neck down.  8.  DO NOT shower/wash with your normal soap after using and rinsing off the CHG Soap.            9.  Pat yourself dry with a clean towel.            10.  Wear clean pajamas.            11.  Place clean sheets on your bed the night of your first shower and do not  sleep with pets.  ON THE DAY  OF SURGERY : Do not apply any lotions/deodorants the morning of surgery.  Please wear clean clothes to the hospital/surgery center.    FAILURE TO FOLLOW THESE INSTRUCTIONS MAY RESULT IN THE CANCELLATION OF YOUR SURGERY  PATIENT SIGNATURE_________________________________  NURSE SIGNATURE__________________________________  ________________________________________________________________________   Adam Phenix    An incentive spirometer is a tool that can help keep your lungs clear and active. This tool measures how well you are filling your lungs with each breath. Taking long deep breaths may help reverse or decrease the chance of developing breathing (pulmonary) problems (especially infection) following: A long period of time when you are unable to move or be active. BEFORE THE PROCEDURE  If the spirometer includes an indicator to show your best effort, your nurse or respiratory therapist will set it to a desired goal. If possible, sit up straight or lean slightly forward. Try not to slouch. Hold the incentive spirometer in an upright position. INSTRUCTIONS FOR USE  Sit on the edge of your bed if possible, or sit up as far as you can in bed or on a chair. Hold the incentive spirometer in an upright position. Breathe out normally. Place the mouthpiece in your mouth and seal your lips tightly around it. Breathe in slowly and as deeply as possible, raising the piston or the ball toward the top of the column. Hold your breath for 3-5 seconds or for as long as possible. Allow the piston or ball to fall to the bottom of the column. Remove the mouthpiece from your mouth and breathe out normally. Rest for a few seconds and repeat Steps 1 through 7 at least 10 times every 1-2 hours when you are awake. Take your time and take a few normal breaths between deep breaths. The spirometer may include an indicator to show your best effort. Use the indicator as a goal to work toward during each  repetition. After each set of 10 deep breaths, practice coughing to be sure your lungs are clear. If you have an incision (the cut made at the time of surgery), support your incision when coughing by placing a pillow or rolled up towels firmly against it. Once you are able to get out of bed, walk around indoors and cough well. You may stop using the incentive spirometer when instructed by your caregiver.  RISKS AND COMPLICATIONS Take your time so you do not get dizzy or light-headed. If you are in pain, you may need to take or ask for pain medication before doing incentive spirometry. It is harder to take a deep breath if you are having pain. AFTER USE Rest and breathe slowly and easily. It can be  helpful to keep track of a log of your progress. Your caregiver can provide you with a simple table to help with this. If you are using the spirometer at home, follow these instructions: [place] IF:  You are having difficultly using the spirometer. You have trouble using the spirometer as often as instructed. Your pain medication is not giving enough relief while using the spirometer. You develop fever of 100.5 F (38.1 C) or higher.                                                                                                    SEEK IMMEDIATE MEDICAL CARE IF:  You cough up bloody sputum that had not been present before. You develop fever of 102 F (38.9 C) or greater. You develop worsening pain at or near the incision site. MAKE SURE YOU:  Understand these instructions. Will watch your condition. Will get help right away if you are not doing well or get worse. Document Released: 03/01/2007 Document Revised: 01/11/2012 Document Reviewed: 05/02/2007 [facility] Patient Information 2014 [place], [place].

## 2022-06-25 NOTE — H&P (Signed)
TOTAL HIP ADMISSION H&P  Patient is admitted for right total hip arthroplasty.  Subjective:  Chief Complaint: Right hip pain  HPI: Evelyn Butler, 56 y.o. female, has a history of pain and functional disability in the right hip due to arthritis and patient has failed non-surgical conservative treatments for greater than 12 weeks to include use of assistive devices, weight reduction as appropriate, and activity modification. Onset of symptoms was gradual, starting  several  years ago with gradually worsening course since that time. The patient noted no past surgery on the right hip. Patient currently rates pain in the right hip at 9 out of 10 with activity. Patient has night pain, worsening of pain with activity and weight bearing, pain that interfers with activities of daily living, and pain with passive range of motion. Patient has evidence of  severe end-stage osteoarthritis, bone-on-bone, throughout with large osteophyte formation  by imaging studies. This condition presents safety issues increasing the risk of falls. There is no current active infection.  Patient Active Problem List   Diagnosis Date Noted   Primary osteoarthritis of left hip 07/04/2021   OA (osteoarthritis) of hip 07/02/2021   S/P total left hip arthroplasty 07/02/2021    Past Medical History:  Diagnosis Date   Anemia    Arthritis    Depression    Fibroid    Hypertension    Pre-diabetes     Past Surgical History:  Procedure Laterality Date   CESAREAN SECTION     x 2   TOTAL HIP ARTHROPLASTY Left 07/02/2021   Procedure: TOTAL HIP ARTHROPLASTY-POSTERIOR;  Surgeon: [name], MD;  Location: [facility];  Service: Orthopedics;  Laterality: Left;    Prior to Admission medications   Medication Sig Start Date End Date Taking? Authorizing Provider  aspirin EC 81 MG tablet Take 81 mg by mouth daily. Swallow whole.   Yes [provider]  hydrochlorothiazide (MICROZIDE) 12.5 MG capsule Take 12.5 mg by  mouth daily.   Yes [provider]  lisinopril (ZESTRIL) 10 MG tablet Take 10 mg by mouth daily.   Yes [provider]  meloxicam (MOBIC) 7.5 MG tablet Take 7.5 mg by mouth 2 (two) times daily. 05/26/22  Yes [provider]  Multiple Vitamins-Minerals (ADULT GUMMY PO) Take 2 capsules by mouth daily. OLLY Brand   Yes [provider]  OVER THE COUNTER MEDICATION Take 1 capsule by mouth daily. OLLY Glowing Skin   Yes [provider]  OVER THE COUNTER MEDICATION Take 1 capsule by mouth daily. OLLY Energy Blend   Yes [provider]  OZEMPIC, 1 MG/DOSE, 4 MG/3ML SOPN Inject 1 mg into the skin every Wednesday. 05/29/22  Yes [provider]  Probiotic Product (PROBIOTIC DAILY PO) Take 1 capsule by mouth daily.   Yes [provider]  traMADol (ULTRAM) 50 MG tablet Take 1-2 tablets (50-100 mg total) by mouth every 6 (six) hours as needed for moderate pain. 07/05/21  Yes Fenton Foy D, PA-C    Allergies  Allergen Reactions   Codeine Anaphylaxis, Shortness Of Breath and Nausea And Vomiting    Closes throat    Gluten Meal     Angioedema and joint swelling  Gluten Sensitive    Monosodium Glutamate Swelling    MSG    Sulfa Antibiotics Nausea And Vomiting    Migraines   Levofloxacin     Facial Swelling   Other Rash    Mesh bed padding     Social History  Socioeconomic History   Marital status: Married    Spouse name: Not on file   Number of children: Not on file   Years of education: Not on file   Highest education level: Not on file  Occupational History   Not on file  Tobacco Use   Smoking status: Former    Packs/day: 0.25    Years: 3.00    Total pack years: 0.75    Types: Cigarettes    Quit date: 02/01/1995    Years since quitting: 27.4   Smokeless tobacco: Never  Vaping Use   Vaping Use: Never used  Substance and Sexual Activity   Alcohol use: No    Alcohol/week: 0.0 standard drinks of alcohol   Drug use:  No   Sexual activity: Yes    Partners: Male  Other Topics Concern   Not on file  Social History Narrative   Not on file   Social Determinants of Health   Financial Resource Strain: Not on file  Food Insecurity: Not on file  Transportation Needs: Not on file  Physical Activity: Not on file  Stress: Not on file  Social Connections: Not on file  Intimate Partner Violence: Not on file    Tobacco Use: Medium Risk (06/23/2022)   Patient History    Smoking Tobacco Use: Former    Smokeless Tobacco Use: Never    Passive Exposure: Not on file   Social History   Substance and Sexual Activity  Alcohol Use No   Alcohol/week: 0.0 standard drinks of alcohol    Family History  Problem Relation Age of Onset   Diabetes Mother    Hypertension Mother    Hypertension Father    Heart disease Father    Epilepsy Maternal Grandmother    Colon cancer Paternal Grandfather     Review of Systems  Constitutional:  Negative for chills and fever.  HENT:  Negative for congestion, sore throat and tinnitus.   Eyes:  Negative for double vision, photophobia and pain.  Respiratory:  Negative for cough, shortness of breath and wheezing.   Cardiovascular:  Negative for chest pain, palpitations and orthopnea.  Gastrointestinal:  Negative for heartburn, nausea and vomiting.  Genitourinary:  Negative for dysuria, frequency and urgency.  Musculoskeletal:  Positive for joint pain.  Neurological:  Negative for dizziness, weakness and headaches.     Objective:  Physical Exam: Well nourished and well developed.  General: Alert and oriented x3, cooperative and pleasant, no acute distress.  Head: normocephalic, atraumatic, neck supple.  Eyes: EOMI.  Musculoskeletal:  Right Hip Exam: The range of motion: Flexion to 80 degrees, Internal Rotation to 0 degrees, External Rotation to 0 degrees, and abduction to 0 degrees without discomfort. There is no tenderness over the greater trochanteric  bursa.  Calves soft and nontender. Motor function intact in LE. Strength 5/5 LE bilaterally. Neuro: Distal pulses 2+. Sensation to light touch intact in LE.  Imaging Review Plain radiographs demonstrate severe degenerative joint disease of the right hip. The bone quality appears to be adequate for age and reported activity level.  Assessment/Plan:  End stage arthritis, right hip  The patient history, physical examination, clinical judgement of the provider and imaging studies are consistent with end stage degenerative joint disease of the right hip and total hip arthroplasty is deemed medically necessary. The treatment options including medical management, injection therapy, arthroscopy and arthroplasty were discussed at length. The risks and benefits of total hip arthroplasty were presented and reviewed. The risks due to aseptic  loosening, infection, stiffness, dislocation/subluxation, thromboembolic complications and other imponderables were discussed. The patient acknowledged the explanation, agreed to proceed with the plan and consent was signed. Patient is being admitted for inpatient treatment for surgery, pain control, PT, OT, prophylactic antibiotics, VTE prophylaxis, progressive ambulation and ADLs and discharge planning.The patient is planning to be discharged  home with home health services .  Therapy Plans: HHPT Disposition: Home with husband Planned DVT Prophylaxis: Aspirin 325 mg BID DME Needed: Gilford Rile PCP: Lin Landsman, MD (clearance received) TXA: IV Allergies: Codeine (anaphylaxis), sulfa Anesthesia Concerns: None Last HgbA1c: Not diabetic.  Pharmacy: [facility] ([address] [place])  Other: - Hydromorphone, tramadol, methocarbamol, aspirin  - Only went home on tramadol with left THA   - Patient was instructed on what medications to stop prior to surgery. - Follow-up visit in 2 weeks with Dr. Wynelle Link - Begin physical therapy following surgery - Pre-operative lab work as  pre-surgical testing - Prescriptions will be provided in hospital at time of discharge  Theresa Duty, PA-C Orthopedic Surgery [facility] [place]

## 2022-06-30 NOTE — Anesthesia Preprocedure Evaluation (Signed)
Anesthesia Evaluation  Patient identified by MRN, date of birth, ID band Patient awake    Reviewed: Allergy & Precautions, NPO status , Patient's Chart, lab work & pertinent test results  Airway Mallampati: III  TM Distance: >3 FB Neck ROM: Full    Dental  (+) Teeth Intact, Dental Advisory Given   Pulmonary former smoker,    Pulmonary exam normal breath sounds clear to auscultation       Cardiovascular hypertension, Pt. on medications Normal cardiovascular exam Rhythm:Regular Rate:Normal     Neuro/Psych PSYCHIATRIC DISORDERS Depression negative neurological ROS     GI/Hepatic negative GI ROS, Neg liver ROS,   Endo/Other  Morbid obesity  Renal/GU negative Renal ROS     Musculoskeletal  (+) Arthritis ,   Abdominal   Peds  Hematology negative hematology ROS (+) Plt 320k   Anesthesia Other Findings   Reproductive/Obstetrics                           Anesthesia Physical Anesthesia Plan  ASA: 3  Anesthesia Plan: Spinal   Post-op Pain Management: Ofirmev IV (intra-op)* and Toradol IV (intra-op)*   Induction: Intravenous  PONV Risk Score and Plan: 2 and Midazolam, TIVA, Dexamethasone and Ondansetron  Airway Management Planned: Natural Airway and Nasal Cannula  Additional Equipment:   Intra-op Plan:   Post-operative Plan:   Informed Consent: I have reviewed the patients History and Physical, chart, labs and discussed the procedure including the risks, benefits and alternatives for the proposed anesthesia with the patient or authorized representative who has indicated his/her understanding and acceptance.     Dental advisory given  Plan Discussed with: CRNA  Anesthesia Plan Comments:        Anesthesia Quick Evaluation

## 2022-07-01 ENCOUNTER — Observation Stay (HOSPITAL_COMMUNITY): Payer: BC Managed Care – PPO

## 2022-07-01 ENCOUNTER — Encounter (HOSPITAL_COMMUNITY)
Admission: RE | Disposition: A | Discharge status: Home / Self Care | Payer: Self-pay | Source: Home / Self Care | Attending: Orthopedic Surgery

## 2022-07-01 ENCOUNTER — Ambulatory Visit (HOSPITAL_COMMUNITY): Payer: BC Managed Care – PPO | Admitting: Anesthesiology

## 2022-07-01 ENCOUNTER — Other Ambulatory Visit: Payer: Self-pay

## 2022-07-01 ENCOUNTER — Observation Stay (HOSPITAL_COMMUNITY)
Admission: RE | Admit: 2022-07-01 | Discharge: 2022-07-03 | Disposition: A | Discharge status: Home / Self Care | Payer: BC Managed Care – PPO | Attending: Orthopedic Surgery | Admitting: Orthopedic Surgery

## 2022-07-01 ENCOUNTER — Encounter (HOSPITAL_COMMUNITY): Payer: Self-pay | Admitting: Orthopedic Surgery

## 2022-07-01 DIAGNOSIS — Z87891 Personal history of nicotine dependence: Secondary | ICD-10-CM | POA: Diagnosis not present

## 2022-07-01 DIAGNOSIS — M1611 Unilateral primary osteoarthritis, right hip: Secondary | ICD-10-CM | POA: Diagnosis not present

## 2022-07-01 DIAGNOSIS — R7303 Prediabetes: Secondary | ICD-10-CM | POA: Insufficient documentation

## 2022-07-01 DIAGNOSIS — I10 Essential (primary) hypertension: Secondary | ICD-10-CM | POA: Insufficient documentation

## 2022-07-01 DIAGNOSIS — Z7982 Long term (current) use of aspirin: Secondary | ICD-10-CM | POA: Diagnosis not present

## 2022-07-01 DIAGNOSIS — Z79899 Other long term (current) drug therapy: Secondary | ICD-10-CM | POA: Diagnosis not present

## 2022-07-01 DIAGNOSIS — Z96642 Presence of left artificial hip joint: Secondary | ICD-10-CM | POA: Insufficient documentation

## 2022-07-01 DIAGNOSIS — Z01818 Encounter for other preprocedural examination: Secondary | ICD-10-CM

## 2022-07-01 HISTORY — PX: TOTAL HIP ARTHROPLASTY: SHX124

## 2022-07-01 LAB — POCT PREGNANCY, URINE: Preg Test, Ur: NEGATIVE

## 2022-07-01 LAB — GLUCOSE, CAPILLARY: Glucose-Capillary: 63 mg/dL — ABNORMAL LOW (ref 70–99)

## 2022-07-01 SURGERY — ARTHROPLASTY, HIP, TOTAL,POSTERIOR APPROACH
Anesthesia: Spinal | Site: Hip | Laterality: Right

## 2022-07-01 MED ORDER — BUPIVACAINE HCL (PF) 0.5 % IJ SOLN
INTRAMUSCULAR | Status: DC | PRN
  Administered 2022-07-01: 15 mL via INTRATHECAL

## 2022-07-01 MED ORDER — FENTANYL CITRATE (PF) 100 MCG/2ML IJ SOLN
INTRAMUSCULAR | Status: AC
  Filled 2022-07-01: qty 2

## 2022-07-01 MED ORDER — FENTANYL CITRATE PF 50 MCG/ML IJ SOSY
PREFILLED_SYRINGE | INTRAMUSCULAR | Status: AC
  Filled 2022-07-01: qty 3

## 2022-07-01 MED ORDER — LACTATED RINGERS IV SOLN: INTRAVENOUS | Status: DC

## 2022-07-01 MED ORDER — METOCLOPRAMIDE HCL 5 MG PO TABS: 5.0000 mg | ORAL_TABLET | Freq: Three times a day (TID) | ORAL | Status: DC | PRN

## 2022-07-01 MED ORDER — HYDROMORPHONE HCL 2 MG PO TABS
2.0000 mg | ORAL_TABLET | ORAL | Status: DC | PRN
  Administered 2022-07-01 – 2022-07-03 (×5): 2 mg via ORAL
  Filled 2022-07-01 (×5): qty 1

## 2022-07-01 MED ORDER — MENTHOL 3 MG MT LOZG: 1.0000 | LOZENGE | OROMUCOSAL | Status: DC | PRN

## 2022-07-01 MED ORDER — DEXTROSE 50 % IV SOLN
0.5000 | Freq: Once | INTRAVENOUS | Status: AC
  Administered 2022-07-01: 25 mL via INTRAVENOUS
  Filled 2022-07-01: qty 50

## 2022-07-01 MED ORDER — ACETAMINOPHEN 325 MG PO TABS: 325.0000 mg | ORAL_TABLET | Freq: Four times a day (QID) | ORAL | Status: DC | PRN

## 2022-07-01 MED ORDER — CHLORHEXIDINE GLUCONATE 0.12 % MT SOLN
15.0000 mL | Freq: Once | OROMUCOSAL | Status: AC
  Administered 2022-07-01: 15 mL via OROMUCOSAL

## 2022-07-01 MED ORDER — PROPOFOL 500 MG/50ML IV EMUL
INTRAVENOUS | Status: DC | PRN
  Administered 2022-07-01: 75 ug/kg/min via INTRAVENOUS

## 2022-07-01 MED ORDER — SODIUM CHLORIDE 0.9 % IV SOLN: INTRAVENOUS | Status: DC

## 2022-07-01 MED ORDER — ASPIRIN 325 MG PO TBEC
325.0000 mg | DELAYED_RELEASE_TABLET | Freq: Two times a day (BID) | ORAL | Status: DC
  Administered 2022-07-02 – 2022-07-03 (×3): 325 mg via ORAL
  Filled 2022-07-01 (×3): qty 1

## 2022-07-01 MED ORDER — FENTANYL CITRATE (PF) 100 MCG/2ML IJ SOLN
INTRAMUSCULAR | Status: DC | PRN
  Administered 2022-07-01: 100 ug via INTRAVENOUS

## 2022-07-01 MED ORDER — ORAL CARE MOUTH RINSE: 15.0000 mL | Freq: Once | OROMUCOSAL | Status: AC

## 2022-07-01 MED ORDER — BUPIVACAINE-EPINEPHRINE (PF) 0.25% -1:200000 IJ SOLN
INTRAMUSCULAR | Status: AC
  Filled 2022-07-01: qty 30

## 2022-07-01 MED ORDER — METOCLOPRAMIDE HCL 5 MG/ML IJ SOLN: 5.0000 mg | Freq: Three times a day (TID) | INTRAMUSCULAR | Status: DC | PRN

## 2022-07-01 MED ORDER — PHENYLEPHRINE HCL (PRESSORS) 10 MG/ML IV SOLN
INTRAVENOUS | Status: DC | PRN
  Administered 2022-07-01: 240 ug via INTRAVENOUS
  Administered 2022-07-01 (×3): 160 ug via INTRAVENOUS
  Administered 2022-07-01: 80 ug via INTRAVENOUS

## 2022-07-01 MED ORDER — HYDROMORPHONE HCL 1 MG/ML IJ SOLN
0.5000 mg | INTRAMUSCULAR | Status: DC | PRN
  Administered 2022-07-01 – 2022-07-02 (×4): 1 mg via INTRAVENOUS
  Filled 2022-07-01 (×4): qty 1

## 2022-07-01 MED ORDER — METHOCARBAMOL 500 MG PO TABS
500.0000 mg | ORAL_TABLET | Freq: Four times a day (QID) | ORAL | Status: DC | PRN
  Administered 2022-07-01 – 2022-07-03 (×4): 500 mg via ORAL
  Filled 2022-07-01 (×4): qty 1

## 2022-07-01 MED ORDER — ONDANSETRON HCL 4 MG PO TABS: 4.0000 mg | ORAL_TABLET | Freq: Four times a day (QID) | ORAL | Status: DC | PRN

## 2022-07-01 MED ORDER — PHENOL 1.4 % MT LIQD: 1.0000 | OROMUCOSAL | Status: DC | PRN

## 2022-07-01 MED ORDER — CEFAZOLIN SODIUM-DEXTROSE 2-4 GM/100ML-% IV SOLN
2.0000 g | INTRAVENOUS | Status: AC
  Administered 2022-07-01: 2 g via INTRAVENOUS
  Filled 2022-07-01: qty 100

## 2022-07-01 MED ORDER — DOCUSATE SODIUM 100 MG PO CAPS
100.0000 mg | ORAL_CAPSULE | Freq: Two times a day (BID) | ORAL | Status: DC
  Administered 2022-07-01 – 2022-07-03 (×5): 100 mg via ORAL
  Filled 2022-07-01 (×5): qty 1

## 2022-07-01 MED ORDER — PHENYLEPHRINE HCL-NACL 20-0.9 MG/250ML-% IV SOLN
INTRAVENOUS | Status: DC | PRN
  Administered 2022-07-01: 40 ug/min via INTRAVENOUS

## 2022-07-01 MED ORDER — POLYETHYLENE GLYCOL 3350 17 G PO PACK
17.0000 g | PACK | Freq: Every day | ORAL | Status: DC | PRN
  Administered 2022-07-03: 17 g via ORAL
  Filled 2022-07-01: qty 1

## 2022-07-01 MED ORDER — DIPHENHYDRAMINE HCL 12.5 MG/5ML PO ELIX: 12.5000 mg | ORAL_SOLUTION | ORAL | Status: DC | PRN

## 2022-07-01 MED ORDER — CEFAZOLIN SODIUM-DEXTROSE 2-4 GM/100ML-% IV SOLN
2.0000 g | Freq: Four times a day (QID) | INTRAVENOUS | Status: AC
  Administered 2022-07-01 (×2): 2 g via INTRAVENOUS
  Filled 2022-07-01 (×2): qty 100

## 2022-07-01 MED ORDER — PHENYLEPHRINE HCL (PRESSORS) 10 MG/ML IV SOLN
INTRAVENOUS | Status: AC
  Filled 2022-07-01: qty 1

## 2022-07-01 MED ORDER — MIDAZOLAM HCL 2 MG/2ML IJ SOLN
INTRAMUSCULAR | Status: AC
  Filled 2022-07-01: qty 2

## 2022-07-01 MED ORDER — ONDANSETRON HCL 4 MG/2ML IJ SOLN: 4.0000 mg | Freq: Four times a day (QID) | INTRAMUSCULAR | Status: DC | PRN

## 2022-07-01 MED ORDER — 0.9 % SODIUM CHLORIDE (POUR BTL) OPTIME
TOPICAL | Status: DC | PRN
  Administered 2022-07-01: 1000 mL

## 2022-07-01 MED ORDER — DEXAMETHASONE SODIUM PHOSPHATE 10 MG/ML IJ SOLN
10.0000 mg | Freq: Once | INTRAMUSCULAR | Status: AC
  Administered 2022-07-02: 10 mg via INTRAVENOUS
  Filled 2022-07-01: qty 1

## 2022-07-01 MED ORDER — MIDAZOLAM HCL 5 MG/5ML IJ SOLN
INTRAMUSCULAR | Status: DC | PRN
  Administered 2022-07-01: 2 mg via INTRAVENOUS

## 2022-07-01 MED ORDER — TRANEXAMIC ACID-NACL 1000-0.7 MG/100ML-% IV SOLN
1000.0000 mg | INTRAVENOUS | Status: AC
  Administered 2022-07-01: 1000 mg via INTRAVENOUS
  Filled 2022-07-01: qty 100

## 2022-07-01 MED ORDER — BISACODYL 10 MG RE SUPP: 10.0000 mg | Freq: Every day | RECTAL | Status: DC | PRN

## 2022-07-01 MED ORDER — TRAMADOL HCL 50 MG PO TABS
50.0000 mg | ORAL_TABLET | Freq: Four times a day (QID) | ORAL | Status: DC | PRN
  Administered 2022-07-01 – 2022-07-03 (×4): 100 mg via ORAL
  Filled 2022-07-01 (×4): qty 2

## 2022-07-01 MED ORDER — ACETAMINOPHEN 10 MG/ML IV SOLN
1000.0000 mg | Freq: Four times a day (QID) | INTRAVENOUS | Status: DC
  Administered 2022-07-01: 1000 mg via INTRAVENOUS
  Filled 2022-07-01: qty 100

## 2022-07-01 MED ORDER — FENTANYL CITRATE PF 50 MCG/ML IJ SOSY
25.0000 ug | PREFILLED_SYRINGE | INTRAMUSCULAR | Status: DC | PRN
  Administered 2022-07-01 (×3): 50 ug via INTRAVENOUS

## 2022-07-01 MED ORDER — POVIDONE-IODINE 10 % EX SWAB
2.0000 | Freq: Once | CUTANEOUS | Status: AC
  Administered 2022-07-01: 2 via TOPICAL

## 2022-07-01 MED ORDER — DEXAMETHASONE SODIUM PHOSPHATE 10 MG/ML IJ SOLN
8.0000 mg | Freq: Once | INTRAMUSCULAR | Status: AC
  Administered 2022-07-01: 8 mg via INTRAVENOUS

## 2022-07-01 MED ORDER — FLEET ENEMA 7-19 GM/118ML RE ENEM: 1.0000 | ENEMA | Freq: Once | RECTAL | Status: DC | PRN

## 2022-07-01 MED ORDER — BUPIVACAINE-EPINEPHRINE (PF) 0.25% -1:200000 IJ SOLN
INTRAMUSCULAR | Status: DC | PRN
  Administered 2022-07-01: 30 mL

## 2022-07-01 MED ORDER — ONDANSETRON HCL 4 MG/2ML IJ SOLN
INTRAMUSCULAR | Status: DC | PRN
  Administered 2022-07-01: 4 mg via INTRAVENOUS

## 2022-07-01 MED ORDER — STERILE WATER FOR IRRIGATION IR SOLN
Status: DC | PRN
  Administered 2022-07-01: 2000 mL

## 2022-07-01 MED ORDER — METHOCARBAMOL 1000 MG/10ML IJ SOLN
500.0000 mg | Freq: Four times a day (QID) | INTRAVENOUS | Status: DC | PRN
  Filled 2022-07-01: qty 5

## 2022-07-01 MED ORDER — HYDROCHLOROTHIAZIDE 12.5 MG PO CAPS
12.5000 mg | ORAL_CAPSULE | Freq: Every day | ORAL | Status: DC
  Filled 2022-07-01: qty 1

## 2022-07-01 MED ORDER — PROMETHAZINE HCL 25 MG/ML IJ SOLN: 6.2500 mg | INTRAMUSCULAR | Status: DC | PRN

## 2022-07-01 SURGICAL SUPPLY — 71 items
BAG COUNTER SPONGE SURGICOUNT (BAG) IMPLANT
BAG DECANTER FOR FLEXI CONT (MISCELLANEOUS) ×1 IMPLANT
BAG ZIPLOCK 12X15 (MISCELLANEOUS) IMPLANT
BIT DRILL 2.8X128 (BIT) ×1 IMPLANT
BLADE EXTENDED COATED 6.5IN (ELECTRODE) ×1 IMPLANT
BLADE SAW SGTL 73X25 THK (BLADE) ×1 IMPLANT
BLADE SURG SZ10 CARB STEEL (BLADE) ×2 IMPLANT
CLOSURE STERI STRIP 1/2 X4 (GAUZE/BANDAGES/DRESSINGS) IMPLANT
COVER SURGICAL LIGHT HANDLE (MISCELLANEOUS) ×1 IMPLANT
CUP ACET PINNACLE SECTR 50MM (Hips) IMPLANT
DRAPE INCISE IOBAN 66X45 STRL (DRAPES) ×1 IMPLANT
DRAPE ORTHO SPLIT 77X108 STRL (DRAPES) ×2
DRAPE POUCH INSTRU U-SHP 10X18 (DRAPES) ×1 IMPLANT
DRAPE SHEET LG 3/4 BI-LAMINATE (DRAPES) IMPLANT
DRAPE SURG ORHT 6 SPLT 77X108 (DRAPES) ×2 IMPLANT
DRAPE U-SHAPE 47X51 STRL (DRAPES) ×1 IMPLANT
DRESSING MEPILEX FLEX 4X4 (GAUZE/BANDAGES/DRESSINGS) IMPLANT
DRSG AQUACEL AG ADV 3.5X10 (GAUZE/BANDAGES/DRESSINGS) ×1 IMPLANT
DRSG AQUACEL AG ADV 3.5X14 (GAUZE/BANDAGES/DRESSINGS) IMPLANT
DRSG MEPILEX FLEX 4X4 (GAUZE/BANDAGES/DRESSINGS)
DURAPREP 26ML APPLICATOR (WOUND CARE) ×1 IMPLANT
ELECT REM PT RETURN 15FT ADLT (MISCELLANEOUS) ×1 IMPLANT
EVACUATOR 1/8 PVC DRAIN (DRAIN) IMPLANT
FACESHIELD WRAPAROUND (MASK) ×4 IMPLANT
FACESHIELD WRAPAROUND OR TEAM (MASK) ×4 IMPLANT
GAUZE SPONGE 4X4 12PLY STRL (GAUZE/BANDAGES/DRESSINGS) ×1 IMPLANT
GLOVE BIO SURGEON STRL SZ 6.5 (GLOVE) ×2 IMPLANT
GLOVE BIO SURGEON STRL SZ7.5 (GLOVE) ×1 IMPLANT
GLOVE BIO SURGEON STRL SZ8 (GLOVE) ×1 IMPLANT
GLOVE BIOGEL PI IND STRL 6.5 (GLOVE) ×1 IMPLANT
GLOVE BIOGEL PI IND STRL 7.0 (GLOVE) ×1 IMPLANT
GLOVE BIOGEL PI IND STRL 8 (GLOVE) ×1 IMPLANT
GLOVE BIOGEL PI INDICATOR 6.5 (GLOVE) ×1
GLOVE BIOGEL PI INDICATOR 7.0 (GLOVE) ×1
GLOVE BIOGEL PI INDICATOR 8 (GLOVE) ×1
GOWN STRL REUS W/ TWL LRG LVL3 (GOWN DISPOSABLE) ×1 IMPLANT
GOWN STRL REUS W/ TWL XL LVL3 (GOWN DISPOSABLE) IMPLANT
GOWN STRL REUS W/TWL LRG LVL3 (GOWN DISPOSABLE) ×1
GOWN STRL REUS W/TWL XL LVL3 (GOWN DISPOSABLE)
HEAD BIO DELA CER SROM 32PLUS3 (Head) IMPLANT
IMMOBILIZER KNEE 20 (SOFTGOODS) ×1
IMMOBILIZER KNEE 20 THIGH 36 (SOFTGOODS) ×1 IMPLANT
KIT BASIN OR (CUSTOM PROCEDURE TRAY) ×1 IMPLANT
KIT TURNOVER KIT A (KITS) IMPLANT
LINER MARATHON 32 50 (Hips) IMPLANT
MANIFOLD NEPTUNE II (INSTRUMENTS) ×1 IMPLANT
MEPILEX BORDER 4 X 4 IN IMPLANT
NDL SAFETY ECLIP 18X1.5 (MISCELLANEOUS) ×2 IMPLANT
NS IRRIG 1000ML POUR BTL (IV SOLUTION) ×1 IMPLANT
PACK TOTAL JOINT (CUSTOM PROCEDURE TRAY) ×1 IMPLANT
PADDING CAST COTTON 6X4 STRL (CAST SUPPLIES) ×1 IMPLANT
PASSER SUT SWANSON 36MM LOOP (INSTRUMENTS) ×1 IMPLANT
PINNACLE SECTOR CUP 50MM (Hips) ×1 IMPLANT
PROTECTOR NERVE ULNAR (MISCELLANEOUS) ×1 IMPLANT
SLEEVE PROX POROUS COATED 16F (Hips) IMPLANT
SPIKE FLUID TRANSFER (MISCELLANEOUS) ×1 IMPLANT
SROM BIO DELA CER HEAD 32PLUS3 (Head) ×1 IMPLANT
STEM FEM MOD STD 36 11X16X150 (Hips) IMPLANT
STRIP CLOSURE SKIN 1/2X4 (GAUZE/BANDAGES/DRESSINGS) ×2 IMPLANT
SUT ETHIBOND NAB CT1 #1 30IN (SUTURE) ×2 IMPLANT
SUT MNCRL AB 4-0 PS2 18 (SUTURE) ×1 IMPLANT
SUT STRATAFIX 0 PDS 27 VIOLET (SUTURE) ×1
SUT VIC AB 2-0 CT1 27 (SUTURE) ×3
SUT VIC AB 2-0 CT1 TAPERPNT 27 (SUTURE) ×3 IMPLANT
SUTURE STRATFX 0 PDS 27 VIOLET (SUTURE) ×1 IMPLANT
SYR 20ML LL LF (SYRINGE) ×1 IMPLANT
SYR 50ML LL SCALE MARK (SYRINGE) IMPLANT
TOWEL OR 17X26 10 PK STRL BLUE (TOWEL DISPOSABLE) ×2 IMPLANT
TOWEL OR NON WOVEN STRL DISP B (DISPOSABLE) ×1 IMPLANT
TRAY FOLEY MTR SLVR 16FR STAT (SET/KITS/TRAYS/PACK) ×1 IMPLANT
WATER STERILE IRR 1000ML POUR (IV SOLUTION) ×1 IMPLANT

## 2022-07-01 NOTE — Anesthesia Procedure Notes (Signed)
Spinal  Patient location during procedure: OR Start time: 07/01/2022 8:54 AM End time: 07/01/2022 8:57 AM Reason for block: surgical anesthesia Staffing Performed: anesthesiologist  Anesthesiologist: Santa Lighter, MD Performed by: Santa Lighter, MD Authorized by: Santa Lighter, MD   Preanesthetic Checklist Completed: patient identified, IV checked, risks and benefits discussed, surgical consent, monitors and equipment checked, pre-op evaluation and timeout performed Spinal Block Patient position: sitting Prep: DuraPrep and site prepped and draped Patient monitoring: continuous pulse ox and blood pressure Approach: midline Location: L3-4 Injection technique: single-shot Needle Needle type: Pencan  Needle gauge: 24 G Assessment Events: CSF return Additional Notes Functioning IV was confirmed and monitors were applied. Sterile prep and drape, including hand hygiene, mask and sterile gloves were used. The patient was positioned and the spine was prepped. The skin was anesthetized with lidocaine.  Free flow of clear CSF was obtained prior to injecting local anesthetic into the CSF.  The spinal needle aspirated freely following injection.  The needle was carefully withdrawn.  The patient tolerated the procedure well. Consent was obtained prior to procedure with all questions answered and concerns addressed. Risks including but not limited to bleeding, infection, nerve damage, paralysis, failed block, inadequate analgesia, allergic reaction, high spinal, itching and headache were discussed and the patient wished to proceed.   Hoy Morn, MD

## 2022-07-01 NOTE — Op Note (Signed)
Pre-operative diagnosis- Osteoarthritis Right hip  Post-operative diagnosis- Osteoarthritis  Right hip  Procedure-  RightTotal Hip Arthroplasty  Surgeon- Evelyn Plover. Larz Mark, MD  Assistant- Jaynie Bream, PA-C   Anesthesia  Spinal  EBL- 700 mL   Drain Hemovac   Complication- None  Condition-PACU - hemodynamically stable.   Brief Clinical Note-  Evelyn Butler is a 56 y.o. female with end stage arthritis of her right hip with progressively worsening pain and dysfunction. Pain occurs with activity and rest including pain at night. She has tried analgesics, protected weight bearing and rest without benefit. Pain is too severe to attempt physical therapy. Radiographs demonstrate bone on bone arthritis with subchondral cyst formation. She presents now for right THA.   Procedure in detail-   The patient is brought into the operating room and placed on the operating table. After successful administration of Spinal  anesthesia, the patient is placed in the  Left lateral decubitus position with the  right side up and held in place with the hip positioner. The lower extremity is isolated from the perineum with plastic drapes and time-out is performed by the surgical team. The lower extremity is then prepped and draped in the usual sterile fashion. A short posterolateral incision is made with a ten blade through the subcutaneous tissue to the level of the fascia lata which is incised in line with the skin incision. The sciatic nerve is palpated and protected and the short external rotators and capsule are isolated from the femur. The hip is then dislocated and the center of the femoral head is marked. A trial prosthesis is placed such that the trial head corresponds to the center of the patients' native femoral head. The resection level is marked on the femoral neck and the resection is made with an oscillating saw. The femoral head is removed and femoral retractors placed to gain access to the  femoral canal.      The canal finder is passed into the femoral canal and the canal is thoroughly irrigated with sterile saline to remove the fatty contents. Axial reaming is performed to 11.5  mm, proximal reaming to 52F  and the sleeve machined to a small. A 16 F small trial sleeve is placed into the proximal femur.      The femur is then retracted anteriorly to gain acetabular exposure. Acetabular retractors are placed and the labrum and osteophytes are removed, Acetabular reaming is performed to 49  mm and a 50  mm Pinnacle acetabular shell is placed in anatomic position with excellent purchase. Additional dome screws were not needed. The permanent 32 mm neutral + 4 Marathon liner is placed into the acetabular shell.      The trial femur is then placed into the femoral canal. The size is 16 x 11  stem with a 36  neck and a 32 + 3 head with the neck version matching  the patients' native anteversion. The hip is reduced with excellent stability with full extension and full external rotation, 70 degrees flexion with 40 degrees adduction and 90 degrees internal rotation and 90 degrees of flexion with 70 degrees of internal rotation. The operative leg is placed on top of the non-operative leg and the leg lengths are found to be equal. The trials are then removed and the permanent implant of the same size is impacted into the femoral canal. The ceramic femoral head of the same size as the trial is placed and the hip is reduced with the same stability  parameters. The operative leg is again placed on top of the non-operative leg and the leg lengths are found to be equal.      The wound is then copiously irrigated with saline solution and the capsule and short external rotators are re-attached to the femur through drill holes with Ethibond suture. The fascia lata is closed over a hemovac drain with #1 vicryl suture and the fascia lata, gluteal muscles and subcutaneous tissues are injected with 20 ml of .25% Marcaine.  The subcutaneous tissues are closed with #1 and2-0 vicryl and the subcuticular layer closed with running 4-0 Monocryl. The drain is hooked to suction, incision cleaned and dried, and steri-srips and a bulky sterile dressing applied. The limb is placed into a knee immobilizer and the patient is awakened and transported to recovery in stable condition.      Please note that a surgical assistant was a medical necessity for this procedure in order to perform it in a safe and expeditious manner. The assistant was necessary to provide retraction to the vital neurovascular structures and to retract and position the limb to allow for anatomic placement of the prosthetic components.  Evelyn Plover Bobbyjo Marulanda, MD    07/01/2022, 11:23 AM

## 2022-07-01 NOTE — Evaluation (Signed)
Physical Therapy Evaluation Patient Details Name: Evelyn Butler [facility] MRN: [id number] DOB: [birth year] Today's Date: 07/01/2022  History of Present Illness  Pt is a 56 y.o. female s/p R-THA posterior approach on 07/01/22. PMH: HTN,  pre-diabetes, L-THA posterior approach 07/02/2021.   Clinical Impression  Evelyn Butler is a 56 y.o. female POD 0 s/p R-THA, posterior approach WITH precautions. Patient reports modified independence using rollator, motorized scooter, lift chair, and stair lift for mobility at baseline. Patient is now limited by functional impairments (see PT problem list below) and requires min assist for bed mobility and for transfers. Patient was able to ambulate 15 feet with RW and min guard level of assist. Patient instructed in exercise to facilitate ROM and circulation to manage edema. Provided incentive spirometer and with Vcs pt able to achieve 1758m. Patient will benefit from continued skilled PT interventions to address impairments and progress towards PLOF. Acute PT will follow to progress mobility, stair mobility, and HEP in preparation for safe discharge home.       Recommendations for follow up therapy are one component of a multi-disciplinary discharge planning process, led by the attending physician.  Recommendations may be updated based on patient status, additional functional criteria and insurance authorization.  Follow Up Recommendations Follow physician's recommendations for discharge plan and follow up therapies      Assistance Recommended at Discharge Intermittent Supervision/Assistance  Patient can return home with the following  A little help with walking and/or transfers;A little help with bathing/dressing/bathroom;Assistance with cooking/housework;Assist for transportation;Help with stairs or ramp for entrance    Equipment Recommendations Other (comment) (Pt needs a youth wide RW secondary to height and weight.)  Recommendations for Other  Services       Functional Status Assessment Patient has had a recent decline in their functional status and demonstrates the ability to make significant improvements in function in a reasonable and predictable amount of time.     Precautions / Restrictions Precautions Precautions: Fall;Posterior Hip Precaution Booklet Issued: Yes (comment) Precaution Comments: Reviewed verbally, pt able to report back via teach back method. Required Braces or Orthoses: Knee Immobilizer - Right Knee Immobilizer - Right: On when out of bed or walking Restrictions Weight Bearing Restrictions: No Other Position/Activity Restrictions: wbat      Mobility  Bed Mobility Overal bed mobility: Needs Assistance Bed Mobility: Supine to Sit     Supine to sit: Min assist     General bed mobility comments: Min assist to bring RLE off bed.    Transfers Overall transfer level: Needs assistance Equipment used: Rolling walker (2 wheels) Transfers: Sit to/from Stand Sit to Stand: From elevated surface, Min assist           General transfer comment: Min assist for steadying of RW, no lift assist required. VCs for hand placement and sequencing.    Ambulation/Gait Ambulation/Gait assistance: Min guard, +2 safety/equipment Gait Distance (Feet): 15 Feet Assistive device: Rolling walker (2 wheels) Gait Pattern/deviations: Step-to pattern, Decreased weight shift to right, Trunk flexed Gait velocity: decreased     General Gait Details: Pt ambulated with RW and min guard, no physical assist required or overt LOB noted, +2 for recliner follow for safety, pt in R-KI. Pt reporting mild dizziness upon initiation of ambulation task but quickly reporting cessation of symptom.  Stairs            Wheelchair Mobility    Modified Rankin (Stroke Patients Only)       Balance Overall balance assessment: Needs assistance  Sitting-balance support: Feet supported, No upper extremity supported Sitting  balance-Leahy Scale: Good     Standing balance support: Reliant on assistive device for balance, During functional activity, Bilateral upper extremity supported Standing balance-Leahy Scale: Poor                               Pertinent Vitals/Pain Pain Assessment Pain Assessment: 0-10 Pain Score: 5  Pain Location: right hip, lower back Pain Descriptors / Indicators: Operative site guarding Pain Intervention(s): Limited activity within patient's tolerance, Monitored during session, Repositioned, Ice applied    Home Living Family/patient expects to be discharged to:: Private residence Living Arrangements: Spouse/significant other;Children Available Help at Discharge: Family;Available 24 hours/day Type of Home: House Home Access: Stairs to enter Entrance Stairs-Rails: None Entrance Stairs-Number of Steps: 1 Alternate Level Stairs-Number of Steps: 15 (stair lift) Home Layout: Two level Home Equipment: Gaffer (4 wheels);Rolling Walker (2 wheels) Additional Comments: Pt also has lift chair.    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, scooter at work, [place] from car to inside work ADLs Comments: IND     Journalist, newspaper   Dominant Hand: Right    Extremity/Trunk Assessment   Upper Extremity Assessment Upper Extremity Assessment: Overall WFL for tasks assessed    Lower Extremity Assessment Lower Extremity Assessment: RLE deficits/detail;LLE deficits/detail RLE Deficits / Details: MMT ank DF/PF 4/5 RLE Sensation: WNL LLE Deficits / Details: MMT ank DF/PF 4/5 LLE Sensation: WNL    Cervical / Trunk Assessment Cervical / Trunk Assessment: Normal  Communication   Communication: No difficulties  Cognition Arousal/Alertness: Awake/alert Behavior During Therapy: WFL for tasks  assessed/performed Overall Cognitive Status: Within Functional Limits for tasks assessed                                          General Comments      Exercises Total Joint Exercises Ankle Circles/Pumps: AROM, Both, 10 reps   Assessment/Plan    PT Assessment Patient needs continued PT services  PT Problem List Decreased strength;Decreased range of motion;Decreased activity tolerance;Decreased balance;Decreased mobility;Decreased coordination;Pain       PT Treatment Interventions DME instruction;Gait training;Stair training;Functional mobility training;Therapeutic activities;Therapeutic exercise;Balance training;Neuromuscular re-education;Patient/family education    PT Goals (Current goals can be found in the Care Plan section)  Acute Rehab PT Goals Patient Stated Goal: To reduce pain PT Goal Formulation: With patient Time For Goal Achievement: 07/08/22 Potential to Achieve Goals: Good    Frequency 7X/week     Co-evaluation               AM-PAC PT "6 Clicks" Mobility  Outcome Measure Help needed turning from your back to your side while in a flat bed without using bedrails?: A Little Help needed moving from lying on your back to sitting on the side of a flat bed without using bedrails?: A Little Help needed moving to and from a bed to a chair (including a wheelchair)?: A Little Help needed standing up from a chair using your arms (e.g., wheelchair or bedside chair)?: A Little Help needed to walk in hospital room?: A Little Help needed climbing 3-5 steps with a railing? : Total 6 Click Score:  16    End of Session Equipment Utilized During Treatment: Gait belt;Right knee immobilizer Activity Tolerance: No increased pain;Patient tolerated treatment well Patient left: in chair;with call bell/phone within reach;with chair alarm set;with family/visitor present;with SCD's reapplied Nurse Communication: Mobility status PT Visit Diagnosis:  Pain;Difficulty in walking, not elsewhere classified (R26.2) Pain - Right/Left: Right Pain - part of body: Hip    Time: 2481-8590 PT Time Calculation (min) (ACUTE ONLY): 28 min   Charges:   PT Evaluation $PT Eval Low Complexity: 1 Low PT Treatments $Gait Training: 8-22 mins        Coolidge Breeze, PT, DPT [facility] Office: [phone] Pager: [phone]  Coolidge Breeze 07/01/2022, 6:23 PM

## 2022-07-01 NOTE — Anesthesia Postprocedure Evaluation (Signed)
Anesthesia Post Note  Patient: [facility]  Procedure(s) Performed: TOTAL HIP ARTHROPLASTY-Posterior (Right: Hip)     Patient location during evaluation: PACU Anesthesia Type: Spinal Level of consciousness: awake, awake and alert and oriented Pain management: pain level controlled Vital Signs Assessment: post-procedure vital signs reviewed and stable Respiratory status: spontaneous breathing, nonlabored ventilation and respiratory function stable Cardiovascular status: blood pressure returned to baseline and stable Postop Assessment: no headache, no backache, spinal receding and no apparent nausea or vomiting Anesthetic complications: no   No notable events documented.  Last Vitals:  Vitals:   07/01/22 1345 07/01/22 1448  BP:  114/63  Pulse:  96  Resp:    Temp: (!) 36.3 C 36.5 C  SpO2:  100%    Last Pain:  Vitals:   07/01/22 1600  TempSrc:   PainSc: 0-No pain                 Santa Lighter

## 2022-07-01 NOTE — Plan of Care (Signed)
  Problem: Activity: Goal: Ability to avoid complications of mobility impairment will improve Outcome: Progressing Goal: Ability to tolerate increased activity will improve Outcome: Progressing   Problem: Activity: Goal: Risk for activity intolerance will decrease Outcome: Progressing   Problem: Safety: Goal: Ability to remain free from injury will improve Outcome: Progressing   Problem: Pain Managment: Goal: General experience of comfort will improve Outcome: Progressing

## 2022-07-01 NOTE — Transfer of Care (Signed)
Immediate Anesthesia Transfer of Care Note  Patient: Evelyn [facility]  Procedure(s) Performed: TOTAL HIP ARTHROPLASTY-Posterior (Right: Hip)  Patient Location: PACU  Anesthesia Type:Spinal  Level of Consciousness: awake, alert  and oriented  Airway & Oxygen Therapy: Patient Spontanous Breathing and Patient connected to face mask oxygen  Post-op Assessment: Report given to RN and Post -op Vital signs reviewed and stable  Post vital signs: Reviewed and stable  Last Vitals:  Vitals Value Taken Time  BP 105/67 07/01/22 1158  Temp    Pulse 81 07/01/22 1202  Resp 11 07/01/22 1202  SpO2 100 % 07/01/22 1202  Vitals shown include unvalidated device data.  Last Pain:  Vitals:   07/01/22 0808  TempSrc:   PainSc: 0-No pain      Patients Stated Pain Goal: 3 (56/38/93 7342)  Complications: No notable events documented.

## 2022-07-01 NOTE — Discharge Instructions (Addendum)
Dr. [name] Total Joint Specialist [facility] [address], [place], [place] [address] [phone]  POSTERIOR TOTAL HIP REPLACEMENT POSTOPERATIVE DIRECTIONS  Hip Rehabilitation, Guidelines Following Surgery  The results of a hip operation are greatly improved after range of motion and muscle strengthening exercises. Follow all safety measures which are given to protect your hip. If any of these exercises cause increased pain or swelling in your joint, decrease the amount until you are comfortable again. Then slowly increase the exercises. Call your caregiver if you have problems or questions.   BLOOD CLOT PREVENTION Take a 325 mg Aspirin two times a day for three weeks following surgery. Then resume one 81 mg Aspirin once a day. You may resume your vitamins/supplements upon discharge from the hospital. Do not take any NSAIDs (Advil, Aleve, Ibuprofen, Meloxicam, etc.) until you have discontinued the 325 mg Aspirin.  PRECAUTIONS (6 WEEKS FOLLOWING SURGERY) Do not bend your hip past a 90 degree angle Do not cross your legs. Don't twist your hip inwards- keep knees and toes pointed upwards   HOME CARE INSTRUCTIONS  Remove items at home which could result in a fall. This includes throw rugs or furniture in walking pathways.  ICE to the affected hip every three hours for 30 minutes at a time and then as needed for pain and swelling.  Continue to use ice on the hip for pain and swelling from surgery. You may notice swelling that will progress down to the foot and ankle.  This is normal after surgery.  Elevate the leg when you are not up walking on it.   Continue to use the breathing machine which will help keep your temperature down.  It is common for your temperature to cycle up and down following surgery, especially at night when you are not up moving around and exerting yourself.  The breathing machine keeps your lungs expanded and your temperature down.  DIET You  may resume your previous home diet once your are discharged from the hospital.  DRESSING / WOUND CARE / SHOWERING Keep the surgical dressing until follow up.  The dressing is water proof, so you can shower without any extra covering.  IF THE DRESSING FALLS OFF or the wound gets wet inside, change the dressing with sterile gauze.  Please use good hand washing techniques before changing the dressing.  Do not use any lotions or creams on the incision until instructed by your surgeon.   You may start showering once you are discharged home but do not submerge the incision under water.  ACTIVITY Walk with your walker as instructed. Use walker as long as suggested by your caregivers. Avoid periods of inactivity such as sitting longer than an hour when not asleep. This helps prevent blood clots.  You may resume a sexual relationship in one month or when given the OK by your doctor.  You may return to work once you are cleared by your doctor.  Do not drive a car for 6 weeks or until released by you surgeon.  Do not drive while taking narcotics.  WEIGHT BEARING Weight bearing as tolerated with assist device (walker, cane, etc) as directed, use it as long as suggested by your surgeon or therapist, typically at least 4-6 weeks.  POSTOPERATIVE CONSTIPATION PROTOCOL Constipation - defined medically as fewer than three stools per week and severe constipation as less than one stool per week.  One of the most common issues patients have following surgery is constipation.  Even  if you have a regular bowel pattern at home, your normal regimen is likely to be disrupted due to multiple reasons following surgery.  Combination of anesthesia, postoperative narcotics, change in appetite and fluid intake all can affect your bowels.  In order to avoid complications following surgery, here are some recommendations in order to help you during your recovery period.  Colace (docusate) - Pick up an over-the-counter form of  Colace or another stool softener and take twice a day as long as you are requiring postoperative pain medications.  Take with a full glass of water daily.  If you experience loose stools or diarrhea, hold the colace until you stool forms back up.  If your symptoms do not get better within 1 week or if they get worse, check with your doctor.  Dulcolax (bisacodyl) - Pick up over-the-counter and take as directed by the product packaging as needed to assist with the movement of your bowels.  Take with a full glass of water.  Use this product as needed if not relieved by Colace only.   MiraLax (polyethylene glycol) - Pick up over-the-counter to have on hand.  MiraLax is a solution that will increase the amount of water in your bowels to assist with bowel movements.  Take as directed and can mix with a glass of water, juice, soda, coffee, or tea.  Take if you go more than two days without a movement. Do not use MiraLax more than once per day. Call your doctor if you are still constipated or irregular after using this medication for 7 days in a row.  If you continue to have problems with postoperative constipation, please contact the office for further assistance and recommendations.  If you experience "the worst abdominal pain ever" or develop nausea or vomiting, please contact the office immediatly for further recommendations for treatment.  ITCHING  If you experience itching with your medications, try taking only a single pain pill, or even half a pain pill at a time.  You can also use Benadryl over the counter for itching or also to help with sleep.   TED HOSE STOCKINGS Wear the elastic stockings on both legs for three weeks following surgery during the day but you may remove then at night for sleeping.  MEDICATIONS See your medication summary on the "After Visit Summary" that the nursing staff will review with you prior to discharge.  You may have some home medications which will be placed on hold  until you complete the course of blood thinner medication.  It is important for you to complete the blood thinner medication as prescribed by your surgeon.  Continue your approved medications as instructed at time of discharge.  PRECAUTIONS If you experience chest pain or shortness of breath - call 911 immediately for transfer to the hospital emergency department.  If you develop a fever greater that 101 F, purulent drainage from wound, increased redness or drainage from wound, foul odor from the wound/dressing, or calf pain - CONTACT YOUR SURGEON.                                                   FOLLOW-UP APPOINTMENTS Make sure you keep all of your appointments after your operation with your surgeon and caregivers. You should call the office at the above phone number and make an appointment for approximately two  weeks after the date of your surgery or on the date instructed by your surgeon outlined in the "After Visit Summary".  RANGE OF MOTION AND STRENGTHENING EXERCISES  These exercises are designed to help you keep full movement of your hip joint. Follow your caregiver's or physical therapist's instructions. Perform all exercises about fifteen times, three times per day or as directed. Exercise both hips, even if you have had only one joint replacement. These exercises can be done on a training (exercise) mat, on the floor, on a table or on a bed. Use whatever works the best and is most comfortable for you. Use music or television while you are exercising so that the exercises are a pleasant break in your day. This will make your life better with the exercises acting as a break in routine you can look forward to.  Lying on your back, slowly slide your foot toward your buttocks, raising your knee up off the floor. Then slowly slide your foot back down until your leg is straight again.  Lying on your back spread your legs as far apart as you can without causing discomfort.  Lying on your side, raise  your upper leg and foot straight up from the floor as far as is comfortable. Slowly lower the leg and repeat.  Lying on your back, tighten up the muscle in the front of your thigh (quadriceps muscles). You can do this by keeping your leg straight and trying to raise your heel off the floor. This helps strengthen the largest muscle supporting your knee.  Lying on your back, tighten up the muscles of your buttocks both with the legs straight and with the knee bent at a comfortable angle while keeping your heel on the floor.   IF YOU ARE TRANSFERRED TO A SKILLED REHAB FACILITY If the patient is transferred to a skilled rehab facility following release from the hospital, a list of the current medications will be sent to the facility for the patient to continue.  When discharged from the skilled rehab facility, please have the facility set up the patient's [place] prior to being released. Also, the skilled facility will be responsible for providing the patient with their medications at time of release from the facility to include their pain medication, the muscle relaxants, and their blood thinner medication. If the patient is still at the rehab facility at time of the two week follow up appointment, the skilled rehab facility will also need to assist the patient in arranging follow up appointment in our office and any transportation needs.  MAKE SURE YOU:  Understand these instructions.  Get help right away if you are not doing well or get worse.   Pick up stool softner and laxative for home use following surgery while on pain medications. Do not submerge incision under water. Please use good hand washing techniques while changing dressing each day. May shower starting three days after surgery. Please use a clean towel to pat the incision dry following showers. Continue to use ice for pain and swelling after surgery. Do not use any lotions or creams on the incision until instructed  by your surgeon.

## 2022-07-01 NOTE — Interval H&P Note (Signed)
History and Physical Interval Note:  07/01/2022 7:32 AM  Hyannis  has presented today for surgery, with the diagnosis of right hip osteoarthritis.  The various methods of treatment have been discussed with the patient and family. After consideration of risks, benefits and other options for treatment, the patient has consented to  Procedure(s): TOTAL HIP ARTHROPLASTY-Posterior (Right) as a surgical intervention.  The patient's history has been reviewed, patient examined, no change in status, stable for surgery.  I have reviewed the patient's chart and labs.  Questions were answered to the patient's satisfaction.     Pilar Plate Leverett [place]

## 2022-07-02 ENCOUNTER — Encounter (HOSPITAL_COMMUNITY): Payer: Self-pay | Admitting: Orthopedic Surgery

## 2022-07-02 DIAGNOSIS — M1611 Unilateral primary osteoarthritis, right hip: Secondary | ICD-10-CM | POA: Diagnosis not present

## 2022-07-02 LAB — CBC
HCT: 30 % — ABNORMAL LOW (ref 36.0–46.0)
Hemoglobin: 10 g/dL — ABNORMAL LOW (ref 12.0–15.0)
MCH: 29.4 pg (ref 26.0–34.0)
MCHC: 33.3 g/dL (ref 30.0–36.0)
MCV: 88.2 fL (ref 80.0–100.0)
Platelets: 240 10*3/uL (ref 150–400)
RBC: 3.4 MIL/uL — ABNORMAL LOW (ref 3.87–5.11)
RDW: 13.3 % (ref 11.5–15.5)
WBC: 11.3 10*3/uL — ABNORMAL HIGH (ref 4.0–10.5)
nRBC: 0 % (ref 0.0–0.2)

## 2022-07-02 LAB — BASIC METABOLIC PANEL
Anion gap: 6 (ref 5–15)
BUN: 22 mg/dL — ABNORMAL HIGH (ref 6–20)
CO2: 21 mmol/L — ABNORMAL LOW (ref 22–32)
Calcium: 8.4 mg/dL — ABNORMAL LOW (ref 8.9–10.3)
Chloride: 108 mmol/L (ref 98–111)
Creatinine, Ser: 0.78 mg/dL (ref 0.44–1.00)
GFR, Estimated: >60 mL/min (ref >60)
Glucose, Bld: 133 mg/dL — ABNORMAL HIGH (ref 70–99)
Potassium: 4.5 mmol/L (ref 3.5–5.1)
Sodium: 135 mmol/L (ref 135–145)

## 2022-07-02 MED ORDER — METHOCARBAMOL 500 MG PO TABS: 500.0000 mg | ORAL_TABLET | Freq: Four times a day (QID) | ORAL | 0 refills | Status: AC | PRN

## 2022-07-02 MED ORDER — ASPIRIN 325 MG PO TBEC: 325.0000 mg | DELAYED_RELEASE_TABLET | Freq: Two times a day (BID) | ORAL | 0 refills | Status: AC

## 2022-07-02 MED ORDER — HYDROCHLOROTHIAZIDE 12.5 MG PO TABS
12.5000 mg | ORAL_TABLET | Freq: Every day | ORAL | Status: DC
  Administered 2022-07-02 – 2022-07-03 (×2): 12.5 mg via ORAL
  Filled 2022-07-02 (×2): qty 1

## 2022-07-02 MED ORDER — HYDROMORPHONE HCL 2 MG PO TABS: 2.0000 mg | ORAL_TABLET | Freq: Four times a day (QID) | ORAL | 0 refills | Status: AC | PRN

## 2022-07-02 MED ORDER — TRAMADOL HCL 50 MG PO TABS: 50.0000 mg | ORAL_TABLET | Freq: Four times a day (QID) | ORAL | 0 refills | Status: AC | PRN

## 2022-07-02 NOTE — TOC Transition Note (Signed)
Transition of Care [facility]) - CM/SW Discharge Note  Patient Details  Name: Evelyn Butler MRN: [id number] Date of Birth: 1966/09/23  [facility] CM/SW Contact:  Sherie Don, LCSW Phone Number: 07/02/2022, 10:55 AM  Clinical Narrative: Patient is expected to discharge home after working with PT. CSW met with patient to review discharge plan and needs. Patient will go home with HHPT, which was not prearranged but [facility] is the preferred [facility] agency for [facility]. CSW made [facility] referral to [facility] with [facility], which was accepted. CSW asked about DME. Patient reported the rolling walker she received last year is broken and is requested a wide walker for youth height. CSW followed up with [employer], [employer], and [employer] and none of these agencies have the type of walker the patient is requesting. Patient reported she will order a rolling walker through [place] updated patient regarding [facility] referral. TOC signing off.    Final next level of care: [place] Barriers to Discharge: No Barriers Identified  Patient Goals and CMS Choice Patient states their goals for this hospitalization and ongoing recovery are:: Discharge home with [place] CMS Medicare.gov Compare Post Acute Care list provided to:: Patient Choice offered to / list presented to : Patient  Discharge Plan and Services         DME Arranged: N/A DME Agency: NA HH Arranged: PT HH Agency: [facility] Date [place] Agency Contacted: 07/02/22 Time [place]: [phone] Representative spoke with at [place]: Delsa Sale  Readmission Risk Interventions     No data to display

## 2022-07-02 NOTE — Progress Notes (Addendum)
   Subjective: 1 Day Post-Op Procedure(s) (LRB): TOTAL HIP ARTHROPLASTY-Posterior (Right) Patient reports pain as mild.   Patient seen in rounds by Dr. Wynelle Link. Patient is well, and has had no acute complaints or problems other than pain in the right hip. No issues overnight. Catheter to be removed this AM. Denies chest pain or SOB. We will continue therapy today, ambulated 15' yesterday.   Objective: Vital signs in last 24 hours: Temp:  [96.4 F (35.8 C)-98.5 F (36.9 C)] 98.2 F (36.8 C) (08/31 0420) Pulse Rate:  [68-122] 108 (08/31 0420) Resp:  [11-20] 15 (08/31 0420) BP: (95-128)/(60-76) 123/60 (08/31 0420) SpO2:  [93 %-100 %] 100 % (08/31 0420) Weight:  [114.3 kg] 114.3 kg (08/30 0808)  Intake/Output from previous day:  Intake/Output Summary (Last 24 hours) at 07/02/2022 0746 Last data filed at 07/02/2022 0610 Gross per 24 hour  Intake 2200 ml  Output 2475 ml  Net -275 ml     Intake/Output this shift: No intake/output data recorded.  Labs: Recent Labs    07/02/22 0359  HGB 10.0*   Recent Labs    07/02/22 0359  WBC 11.3*  RBC 3.40*  HCT 30.0*  PLT 240   Recent Labs    07/02/22 0359  NA 135  K 4.5  CL 108  CO2 21*  BUN 22*  CREATININE 0.78  GLUCOSE 133*  CALCIUM 8.4*   No results for input(s): "LABPT", "INR" in the last 72 hours.  Exam: General - Patient is Alert and Oriented Extremity - Neurologically intact Neurovascular intact Sensation intact distally Dorsiflexion/Plantar flexion intact Dressing - dressing C/D/I Motor Function - intact, moving foot and toes well on exam.   Past Medical History:  Diagnosis Date   Anemia    Arthritis    Depression    Fibroid    Hypertension    Pre-diabetes     Assessment/Plan: 1 Day Post-Op Procedure(s) (LRB): TOTAL HIP ARTHROPLASTY-Posterior (Right) Principal Problem:   Osteoarthritis of right hip  Estimated body mass index is 49.13 kg/m as calculated from the following:   Height as of this  encounter: 5' 0.05" (1.525 m).   Weight as of this encounter: 114.3 kg. Advance diet Up with therapy  DVT Prophylaxis - Aspirin Weight bearing as tolerated. Continue therapy. Posterior hip precautions  I/O in the negative this AM, 500 mL bolus ordered.  Plan is to go Home after hospital stay. She will probably need until tomorrow to be ready for safe discharge, but can go today if meeting goals. HHPT ordered Follow-up in the office in 2 weeks  The [place] was reviewed today prior to any opioid medications being prescribed to this patient.  Theresa Duty, PA-C Orthopedic Surgery [phone] 07/02/2022, 7:46 AM

## 2022-07-02 NOTE — Plan of Care (Signed)
  Problem: Education: Goal: Knowledge of the prescribed therapeutic regimen will improve Outcome: Progressing   Problem: Pain Management: Goal: Pain level will decrease with appropriate interventions Outcome: Progressing   Problem: Education: Goal: Knowledge of General Education information will improve Description: Including pain rating scale, medication(s)/side effects and non-pharmacologic comfort measures Outcome: Progressing   Problem: Safety: Goal: Ability to remain free from injury will improve Outcome: Progressing

## 2022-07-02 NOTE — Progress Notes (Signed)
Physical Therapy Treatment Patient Details Name: Evelyn Butler [facility] MRN: [id number] DOB: [birth year] Today's Date: 07/02/2022   History of Present Illness Pt is a 56 y.o. female s/p R-THA posterior approach on 07/01/22. PMH: HTN,  pre-diabetes, L-THA posterior approach 07/02/2021.    PT Comments    Patient has made excellent progress this visit, adnacing right leg well,  patient does report that right knee feels "weak" and stopped several times to rest. Patient progressing and anticipate will meet goals to DC home soon, Has 1 STE.    Recommendations for follow up therapy are one component of a multi-disciplinary discharge planning process, led by the attending physician.  Recommendations may be updated based on patient status, additional functional criteria and insurance authorization.  Follow Up Recommendations  Follow physician's recommendations for discharge plan and follow up therapies     Assistance Recommended at Discharge Intermittent Supervision/Assistance  Patient can return home with the following A little help with walking and/or transfers;A little help with bathing/dressing/bathroom;Help with stairs or ramp for entrance;Assistance with cooking/housework;Assist for transportation   Equipment Recommendations  Rolling walker (2 wheels)    Recommendations for Other Services       Precautions / Restrictions Precautions Precautions: Fall;Posterior Hip Precaution Booklet Issued: Yes (comment) Precaution Comments: Reviewed verbally, pt able to report back via teach back method., KI DC'd per  ortho PA Restrictions Other Position/Activity Restrictions: wbat     Mobility  Bed Mobility Overal bed mobility: Needs Assistance Bed Mobility: Sit to Supine     Supine to sit: Supervision Sit to supine: Min assist   General bed mobility comments: spouse present to lift legs onto bed.    Transfers Overall transfer level: Needs assistance Equipment used: Rolling walker (2  wheels) Transfers: Sit to/from Stand Sit to Stand: Min guard           General transfer comment: cues for hand placement from recliner.    Ambulation/Gait Ambulation/Gait assistance: Min assist Gait Distance (Feet): 120 Feet Assistive device: Rolling walker (2 wheels) Gait Pattern/deviations: Step-to pattern, Step-through pattern Gait velocity: decreased     General Gait Details: pt. reports right knee weakness, stopped  x 4 for a rest.   Stairs             Wheelchair Mobility    Modified Rankin (Stroke Patients Only)       Balance   Sitting-balance support: Feet supported, No upper extremity supported Sitting balance-Leahy Scale: Good     Standing balance support: Reliant on assistive device for balance, During functional activity, Bilateral upper extremity supported Standing balance-Leahy Scale: Poor                              Cognition Arousal/Alertness: Awake/alert                                              Exercises   General Comments        Pertinent Vitals/Pain Pain Assessment Pain Score: 3  Pain Location: right hip, lower back Pain Descriptors / Indicators: Operative site guarding Pain Intervention(s): Premedicated before session, Monitored during session, Repositioned, Ice applied    Home Living                          Prior Function  PT Goals (current goals can now be found in the care plan section) Progress towards PT goals: Progressing toward goals    Frequency    7X/week      PT Plan Current plan remains appropriate    Co-evaluation              AM-PAC PT "6 Clicks" Mobility   Outcome Measure  Help needed turning from your back to your side while in a flat bed without using bedrails?: A Little Help needed moving from lying on your back to sitting on the side of a flat bed without using bedrails?: A Little Help needed moving to and from a bed to a chair  (including a wheelchair)?: A Little Help needed standing up from a chair using your arms (e.g., wheelchair or bedside chair)?: A Little Help needed to walk in hospital room?: A Little Help needed climbing 3-5 steps with a railing? : A Lot 6 Click Score: 17    End of Session Equipment Utilized During Treatment: Gait belt Activity Tolerance: Patient tolerated treatment well Patient left: in chair;with call bell/phone within reach;with chair alarm set;with family/visitor present;with SCD's reapplied Nurse Communication: Mobility status PT Visit Diagnosis: Pain;Difficulty in walking, not elsewhere classified (R26.2) Pain - Right/Left: Right Pain - part of body: Hip     Time: 0938-1829 PT Time Calculation (min) (ACUTE ONLY): 24 min  Charges:  $Gait Training: 23-37 mins                    [place] Office [phone] Weekend pager-[phone]    Claretha Cooper 07/02/2022, 4:40 PM

## 2022-07-02 NOTE — Plan of Care (Signed)
  Problem: Activity: Goal: Ability to avoid complications of mobility impairment will improve Outcome: Progressing Goal: Ability to tolerate increased activity will improve Outcome: Progressing   Problem: Pain Management: Goal: Pain level will decrease with appropriate interventions Outcome: Progressing   

## 2022-07-02 NOTE — Progress Notes (Signed)
Physical Therapy Treatment Patient Details Name: Emberlynn Riggan [facility] MRN: [id number] DOB: 07-Jul-1966 Today's Date: 07/02/2022   History of Present Illness Pt is a 56 y.o. female s/p R-THA posterior approach on 07/01/22. PMH: HTN,  pre-diabetes, L-THA posterior approach 07/02/2021.    PT Comments    The patient  reports receiving IV pain meds and is a little sleepy. Patient able to progress to  sitting  up to bed edge with supervision, Ambulated slowly x 40', cues for sequence and posture.   Continue PT for safety, precautions and mobility.   Recommendations for follow up therapy are one component of a multi-disciplinary discharge planning process, led by the attending physician.  Recommendations may be updated based on patient status, additional functional criteria and insurance authorization.  Follow Up Recommendations  Follow physician's recommendations for discharge plan and follow up therapies     Assistance Recommended at Discharge Intermittent Supervision/Assistance  Patient can return home with the following A little help with walking and/or transfers;A little help with bathing/dressing/bathroom;Help with stairs or ramp for entrance;Assistance with cooking/housework;Assist for transportation   Equipment Recommendations  Rolling walker (2 wheels) (bari)    Recommendations for Other Services       Precautions / Restrictions Precautions Precautions: Fall;Posterior Hip Precaution Booklet Issued: Yes (comment) Precaution Comments: Reviewed verbally, pt able to report back via teach back method., KI DC'd per  ortho PA     Mobility  Bed Mobility Overal bed mobility: Needs Assistance Bed Mobility: Supine to Sit     Supine to sit: Supervision     General bed mobility comments: use of belt to self assist  RLE.    Transfers Overall transfer level: Needs assistance Equipment used: Rolling walker (2 wheels) Transfers: Sit to/from Stand, Bed to chair/wheelchair/BSC Sit to  Stand: From elevated surface, Min assist           General transfer comment: Min assist for steadying of RW, no lift assist required.  Stepped to [facility].VCs for hand placement and sequencing.    Ambulation/Gait Ambulation/Gait assistance: Min guard, +2 safety/equipment Gait Distance (Feet): 40 Feet Assistive device: Rolling walker (2 wheels) Gait Pattern/deviations: Step-to pattern, Step-through pattern, Trunk flexed Gait velocity: decreased     General Gait Details: cues for sequence  and posture   Stairs             Wheelchair Mobility    Modified Rankin (Stroke Patients Only)       Balance   Sitting-balance support: Feet supported, No upper extremity supported Sitting balance-Leahy Scale: Good     Standing balance support: Reliant on assistive device for balance, During functional activity, Bilateral upper extremity supported Standing balance-Leahy Scale: Poor                              Cognition Arousal/Alertness: Awake/alert                                              Exercises Total Joint Exercises Ankle Circles/Pumps: AROM Heel Slides: AAROM, Right, 10 reps Hip ABduction/ADduction: AAROM, Right, 10 reps    General Comments        Pertinent Vitals/Pain Pain Assessment Pain Score: 4  Pain Location: right hip, lower back Pain Descriptors / Indicators: Operative site guarding Pain Intervention(s): Monitored during session, Premedicated before session, Ice applied  Home Living                          Prior Function            PT Goals (current goals can now be found in the care plan section) Progress towards PT goals: Progressing toward goals    Frequency    7X/week      PT Plan Current plan remains appropriate    Co-evaluation              AM-PAC PT "6 Clicks" Mobility   Outcome Measure  Help needed turning from your back to your side while in a flat bed without using  bedrails?: A Little Help needed moving from lying on your back to sitting on the side of a flat bed without using bedrails?: A Little Help needed moving to and from a bed to a chair (including a wheelchair)?: A Little Help needed standing up from a chair using your arms (e.g., wheelchair or bedside chair)?: A Little Help needed to walk in hospital room?: A Little Help needed climbing 3-5 steps with a railing? : A Lot 6 Click Score: 17    End of Session Equipment Utilized During Treatment: Gait belt Activity Tolerance: Patient tolerated treatment well Patient left: in chair;with call bell/phone within reach;with chair alarm set;with family/visitor present;with SCD's reapplied Nurse Communication: Mobility status PT Visit Diagnosis: Pain;Difficulty in walking, not elsewhere classified (R26.2) Pain - Right/Left: Right Pain - part of body: Hip     Time: 1101-1149 PT Time Calculation (min) (ACUTE ONLY): 48 min  Charges:  $Gait Training: 8-22 mins $Therapeutic Exercise: 8-22 mins $Self Care/Home Management: [place] Office [phone] Weekend [phone]    Claretha Cooper 07/02/2022, 1:43 PM

## 2022-07-02 NOTE — Progress Notes (Signed)
Patient's heart rate was in the low 100's overnight.

## 2022-07-03 DIAGNOSIS — M1611 Unilateral primary osteoarthritis, right hip: Secondary | ICD-10-CM | POA: Diagnosis not present

## 2022-07-03 LAB — CBC
HCT: 25.6 % — ABNORMAL LOW (ref 36.0–46.0)
Hemoglobin: 8.5 g/dL — ABNORMAL LOW (ref 12.0–15.0)
MCH: 28.9 pg (ref 26.0–34.0)
MCHC: 33.2 g/dL (ref 30.0–36.0)
MCV: 87.1 fL (ref 80.0–100.0)
Platelets: 198 10*3/uL (ref 150–400)
RBC: 2.94 MIL/uL — ABNORMAL LOW (ref 3.87–5.11)
RDW: 13.5 % (ref 11.5–15.5)
WBC: 12.8 10*3/uL — ABNORMAL HIGH (ref 4.0–10.5)
nRBC: 0 % (ref 0.0–0.2)

## 2022-07-03 MED ORDER — CALCIUM CARBONATE ANTACID 500 MG PO CHEW
1.0000 | CHEWABLE_TABLET | Freq: Two times a day (BID) | ORAL | Status: DC
  Administered 2022-07-03: 200 mg via ORAL
  Filled 2022-07-03: qty 1

## 2022-07-03 NOTE — Progress Notes (Signed)
Discharge package printed and instructions given to patient. Patient verbalizes understanding. 

## 2022-07-03 NOTE — Plan of Care (Signed)
  Problem: Pain Management: Goal: Pain level will decrease with appropriate interventions Outcome: Progressing   Problem: Activity: Goal: Risk for activity intolerance will decrease Outcome: Progressing   

## 2022-07-03 NOTE — Progress Notes (Signed)
Physical Therapy Treatment Patient Details Name: Evelyn Butler [facility] MRN: [id number] DOB: [birth year] Today's Date: 07/03/2022   History of Present Illness Pt is a 56 y.o. female s/p R-THA posterior approach on 07/01/22. PMH: HTN,  pre-diabetes, L-THA posterior approach 07/02/2021.    PT Comments    Patient has met PT goals for Dc.   Recommendations for follow up therapy are one component of a multi-disciplinary discharge planning process, led by the attending physician.  Recommendations may be updated based on patient status, additional functional criteria and insurance authorization.  Follow Up Recommendations  Follow physician's recommendations for discharge plan and follow up therapies     Assistance Recommended at Discharge Intermittent Supervision/Assistance  Patient can return home with the following A little help with walking and/or transfers;A little help with bathing/dressing/bathroom;Help with stairs or ramp for entrance;Assistance with cooking/housework;Assist for transportation   Equipment Recommendations  Rolling walker (2 wheels)    Recommendations for Other Services       Precautions / Restrictions Precautions Precautions: Fall;Posterior Hip Precaution Comments: Reviewed verbally, pt able to report back via teach back method., KI DC'd per  ortho PA     Mobility  Bed Mobility               General bed mobility comments: in recliner    Transfers Overall transfer level: Needs assistance Equipment used: Rolling walker (2 wheels) Transfers: Sit to/from Stand Sit to Stand: Min guard           General transfer comment: cues for hand placement from recliner.    Ambulation/Gait Ambulation/Gait assistance: Min guard Gait Distance (Feet): 120 Feet Assistive device: Rolling walker (2 wheels) Gait Pattern/deviations: Step-to pattern, Step-through pattern       General Gait Details: stops to rest, reports arms a r weak, feels RW too  high.   Stairs Stairs: Yes Stairs assistance: Min assist Stair Management: No rails, Backwards, With walker Number of Stairs: 1 General stair comments: patient's step is half height of the one she practiced , she plans to go forward   Wheelchair Mobility    Modified Rankin (Stroke Patients Only)       Balance           Standing balance support: Bilateral upper extremity supported, Reliant on assistive device for balance Standing balance-Leahy Scale: Good                              Cognition Arousal/Alertness: Awake/alert                                              Exercises      General Comments        Pertinent Vitals/Pain Pain Assessment Pain Score: 4  Pain Location: arms ar sore, thigh sore Pain Descriptors / Indicators: Sore Pain Intervention(s): Monitored during session, Premedicated before session    Home Living                          Prior Function            PT Goals (current goals can now be found in the care plan section) Progress towards PT goals: Progressing toward goals    Frequency    7X/week      PT Plan Current plan remains appropriate  Co-evaluation              AM-PAC PT "6 Clicks" Mobility   Outcome Measure  Help needed turning from your back to your side while in a flat bed without using bedrails?: A Little Help needed moving from lying on your back to sitting on the side of a flat bed without using bedrails?: A Little Help needed moving to and from a bed to a chair (including a wheelchair)?: A Little Help needed standing up from a chair using your arms (e.g., wheelchair or bedside chair)?: A Little Help needed to walk in hospital room?: A Little Help needed climbing 3-5 steps with a railing? : A Little 6 Click Score: 18    End of Session Equipment Utilized During Treatment: Gait belt Activity Tolerance: Patient tolerated treatment well Patient left: in  chair;with call bell/phone within reach Nurse Communication: Mobility status PT Visit Diagnosis: Pain;Difficulty in walking, not elsewhere classified (R26.2) Pain - Right/Left: Right Pain - part of body: Hip     Time: 7829-5621 PT Time Calculation (min) (ACUTE ONLY): 32 min  Charges:  $Gait Training: 23-37 mins                     Tresa Endo [place] Office [phone] Weekend [phone]    Claretha Cooper 07/03/2022, 2:14 PM

## 2022-07-03 NOTE — Progress Notes (Signed)
   Subjective: 2 Days Post-Op Procedure(s) (LRB): TOTAL HIP ARTHROPLASTY-Posterior (Right) Patient reports pain as mild.   Patient seen in rounds for Dr. Wynelle Link. Patient is well, and has had no acute complaints or problems other than pain in the right hip. Did exceptionally well with therapy yesterday. Denies chest pain or SOB. Some mild indigestion this morning.  Plan is to go Home after hospital stay.  Objective: Vital signs in last 24 hours: Temp:  [98.1 F (36.7 C)-98.5 F (36.9 C)] 98.3 F (36.8 C) (09/01 0629) Pulse Rate:  [97-109] 106 (09/01 0629) Resp:  [17-18] 17 (09/01 0629) BP: (116-128)/(53-73) 128/59 (09/01 0629) SpO2:  [99 %-100 %] 100 % (09/01 0629)  Intake/Output from previous day:  Intake/Output Summary (Last 24 hours) at 07/03/2022 0736 Last data filed at 07/03/2022 0200 Gross per 24 hour  Intake 240 ml  Output 400 ml  Net -160 ml    Intake/Output this shift: No intake/output data recorded.  Labs: Recent Labs    07/02/22 0359 07/03/22 0354  HGB 10.0* 8.5*   Recent Labs    07/02/22 0359 07/03/22 0354  WBC 11.3* 12.8*  RBC 3.40* 2.94*  HCT 30.0* 25.6*  PLT 240 198   Recent Labs    07/02/22 0359  NA 135  K 4.5  CL 108  CO2 21*  BUN 22*  CREATININE 0.78  GLUCOSE 133*  CALCIUM 8.4*   No results for input(s): "LABPT", "INR" in the last 72 hours.  Exam: General - Patient is Alert and Oriented Extremity - Neurologically intact Neurovascular intact Sensation intact distally Dorsiflexion/Plantar flexion intact Dressing/Incision - clean, dry, no drainage Motor Function - intact, moving foot and toes well on exam.   Past Medical History:  Diagnosis Date   Anemia    Arthritis    Depression    Fibroid    Hypertension    Pre-diabetes     Assessment/Plan: 2 Days Post-Op Procedure(s) (LRB): TOTAL HIP ARTHROPLASTY-Posterior (Right) Principal Problem:   Osteoarthritis of right hip  Estimated body mass index is 49.13 kg/m as  calculated from the following:   Height as of this encounter: 5' 0.05" (1.525 m).   Weight as of this encounter: 114.3 kg. Up with therapy  DVT Prophylaxis - Aspirin Weight-bearing as tolerated  Discharge home with HHPT today once cleared with PT. Follow-up in 2 weeks.  Theresa Duty, PA-C Orthopedic Surgery [phone] 07/03/2022, 7:36 AM

## 2022-07-08 NOTE — Discharge Summary (Signed)
Patient ID: [facility] MRN: [id number] DOB/AGE: Jun 29, 1966 56 y.o.  Admit date: 07/01/2022 Discharge date: 07/03/2022  Admission Diagnoses:  Principal Problem:   Osteoarthritis of right hip   Discharge Diagnoses:  Same  Past Medical History:  Diagnosis Date   Anemia    Arthritis    Depression    Fibroid    Hypertension    Pre-diabetes     Surgeries: Procedure(s): TOTAL HIP ARTHROPLASTY-Posterior on 07/01/2022   Consultants:   Discharged Condition: Improved  Hospital Course: Evelyn Butler is an 56 y.o. female who was admitted 07/01/2022 for operative treatment ofOsteoarthritis of right hip. Patient has severe unremitting pain that affects sleep, daily activities, and work/hobbies. After pre-op clearance the patient was taken to the operating room on 07/01/2022 and underwent  Procedure(s): TOTAL HIP ARTHROPLASTY-Posterior.    Patient was given perioperative antibiotics:  Anti-infectives (From admission, onward)    Start     Dose/Rate Route Frequency Ordered Stop   07/01/22 1530  ceFAZolin (ANCEF) IVPB 2g/100 mL premix        2 g 200 mL/hr over 30 Minutes Intravenous Every 6 hours 07/01/22 1441 07/01/22 2203   07/01/22 0745  ceFAZolin (ANCEF) IVPB 2g/100 mL premix        2 g 200 mL/hr over 30 Minutes Intravenous On call to O.R. 07/01/22 5916 07/01/22 3846        Patient was given sequential compression devices, early ambulation, and chemoprophylaxis to prevent DVT.  Patient benefited maximally from hospital stay and there were no complications.    Recent vital signs: No data found.   Recent laboratory studies: No results for input(s): "WBC", "HGB", "HCT", "PLT", "NA", "K", "CL", "CO2", "BUN", "CREATININE", "GLUCOSE", "INR", "CALCIUM" in the last 72 hours.  Invalid input(s): "PT", "2"   Discharge Medications:   Allergies as of 07/03/2022       Reactions   Codeine Anaphylaxis, Shortness Of Breath, Nausea And Vomiting   Closes throat    Gluten  Meal    Angioedema and joint swelling Gluten Sensitive    Monosodium Glutamate Swelling   MSG    Sulfa Antibiotics Nausea And Vomiting   Migraines   Levofloxacin    Facial Swelling   Other Rash   Mesh bed padding         Medication List     STOP taking these medications    meloxicam 7.5 MG tablet Commonly known as: MOBIC       TAKE these medications    ADULT GUMMY PO Take 2 capsules by mouth daily. OLLY Brand   aspirin EC 325 MG tablet Take 1 tablet (325 mg total) by mouth 2 (two) times daily for 20 days. Then resume one 81 mg aspirin once a day. What changed:  medication strength how much to take when to take this additional instructions   hydrochlorothiazide 12.5 MG capsule Commonly known as: MICROZIDE Take 12.5 mg by mouth daily.   HYDROmorphone 2 MG tablet Commonly known as: DILAUDID Take 1-2 tablets (2-4 mg total) by mouth every 6 (six) hours as needed for severe pain.   lisinopril 10 MG tablet Commonly known as: ZESTRIL Take 10 mg by mouth daily.   methocarbamol 500 MG tablet Commonly known as: ROBAXIN Take 1 tablet (500 mg total) by mouth every 6 (six) hours as needed for muscle spasms.   OVER THE COUNTER MEDICATION Take 1 capsule by mouth daily. OLLY Glowing Skin   OVER THE COUNTER MEDICATION Take 1 capsule by mouth daily. OLLY  Energy Blend   Ozempic (1 MG/DOSE) 4 MG/3ML Sopn Generic drug: Semaglutide (1 MG/DOSE) Inject 1 mg into the skin every Wednesday.   PROBIOTIC DAILY PO Take 1 capsule by mouth daily.   traMADol 50 MG tablet Commonly known as: ULTRAM Take 1-2 tablets (50-100 mg total) by mouth every 6 (six) hours as needed for moderate pain.               Discharge Care Instructions  (From admission, onward)           Start     Ordered   07/02/22 0000  Weight bearing as tolerated        07/02/22 0751   07/02/22 0000  Change dressing       Comments: You have an adhesive waterproof bandage over the incision. Leave  this in place until your first follow-up appointment. Once you remove this you will not need to place another bandage.   07/02/22 0751            Diagnostic Studies: DG Pelvis Portable  Result Date: 07/01/2022 CLINICAL DATA:  Postop right shoulder hip EXAM: PORTABLE PELVIS 1-2 VIEWS COMPARISON:  Radiograph 07/02/2021 FINDINGS: New right hip arthroplasty is in normal alignment thought evidence of loosening or periprosthetic fracture. Expected soft tissue changes. Unchanged left hip arthroplasty. IMPRESSION: Right hip arthroplasty is in normal alignment without evidence of immediate hardware complication. Electronically Signed   By: Maurine Simmering M.D.   On: 07/01/2022 12:44    Disposition: Discharge disposition: 01-Home or Self Care       Discharge Instructions     Call MD / Call 911   Complete by: As directed    If you experience chest pain or shortness of breath, CALL 911 and be transported to the hospital emergency room.  If you develope a fever above 101 F, pus (white drainage) or increased drainage or redness at the wound, or calf pain, call your surgeon's office.   Change dressing   Complete by: As directed    You have an adhesive waterproof bandage over the incision. Leave this in place until your first follow-up appointment. Once you remove this you will not need to place another bandage.   Constipation Prevention   Complete by: As directed    Drink plenty of fluids.  Prune juice may be helpful.  You may use a stool softener, such as Colace (over the counter) 100 mg twice a day.  Use MiraLax (over the counter) for constipation as needed.   Diet - low sodium heart healthy   Complete by: As directed    Do not sit on low chairs, stoools or toilet seats, as it may be difficult to get up from low surfaces   Complete by: As directed    Driving restrictions   Complete by: As directed    No driving for two weeks   Follow the hip precautions as taught in Physical Therapy   Complete  by: As directed    Post-operative opioid taper instructions:   Complete by: As directed    POST-OPERATIVE OPIOID TAPER INSTRUCTIONS: It is important to wean off of your opioid medication as soon as possible. If you do not need pain medication after your surgery it is ok to stop day one. Opioids include: Codeine, Hydrocodone(Norco, Vicodin), Oxycodone(Percocet, oxycontin) and hydromorphone amongst others.  Long term and even short term use of opiods can cause: Increased pain response Dependence Constipation Depression Respiratory depression And more.  Withdrawal symptoms can include Flu  like symptoms Nausea, vomiting And more Techniques to manage these symptoms Hydrate well Eat regular healthy meals Stay active Use relaxation techniques(deep breathing, meditating, yoga) Do Not substitute Alcohol to help with tapering If you have been on opioids for less than two weeks and do not have pain than it is ok to stop all together.  Plan to wean off of opioids This plan should start within one week post op of your joint replacement. Maintain the same interval or time between taking each dose and first decrease the dose.  Cut the total daily intake of opioids by one tablet each day Next start to increase the time between doses. The last dose that should be eliminated is the evening dose.      TED hose   Complete by: As directed    Use stockings (TED hose) for three weeks on both leg(s).  You may remove them at night for sleeping.   Weight bearing as tolerated   Complete by: As directed         Follow-up Information     Aluisio, Pilar Plate, MD. Schedule an appointment as soon as possible for a visit in 2 week(s).   Specialty: Orthopedic Surgery Contact information: [address] [place] [address] [phone]         [facility] Follow up.   Why: [facility] will provide PT in the home after discharge.                 Signed: Theresa Duty 07/08/2022, 8:15 AM

## 2022-12-30 IMAGING — DX DG PORTABLE PELVIS
2 series · 2 of 2 positions shown · non-contrast
Comparison: Hip radiographs 05/29/2018

CLINICAL DATA: Status post left hip surgery

EXAM:
PORTABLE PELVIS 1-2 VIEWS

[pelvis ap]
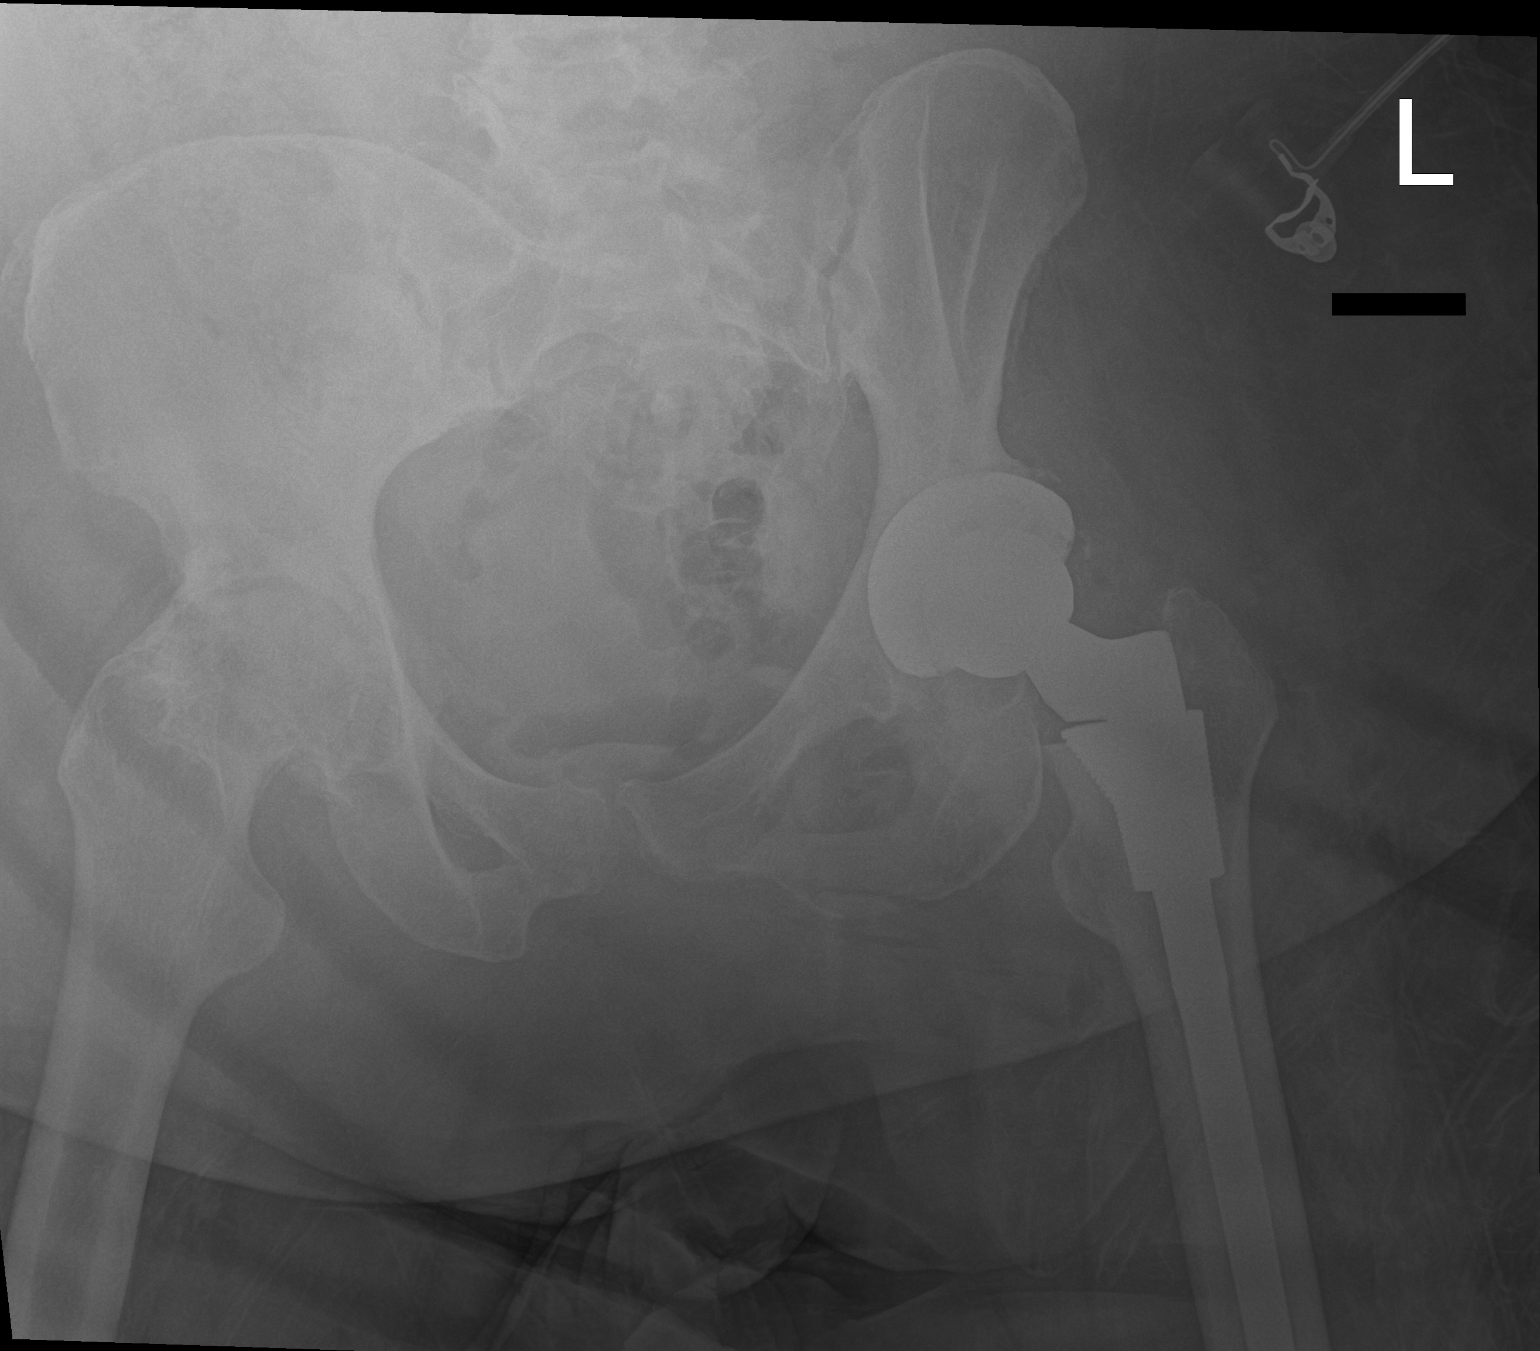

[pelvis lat]
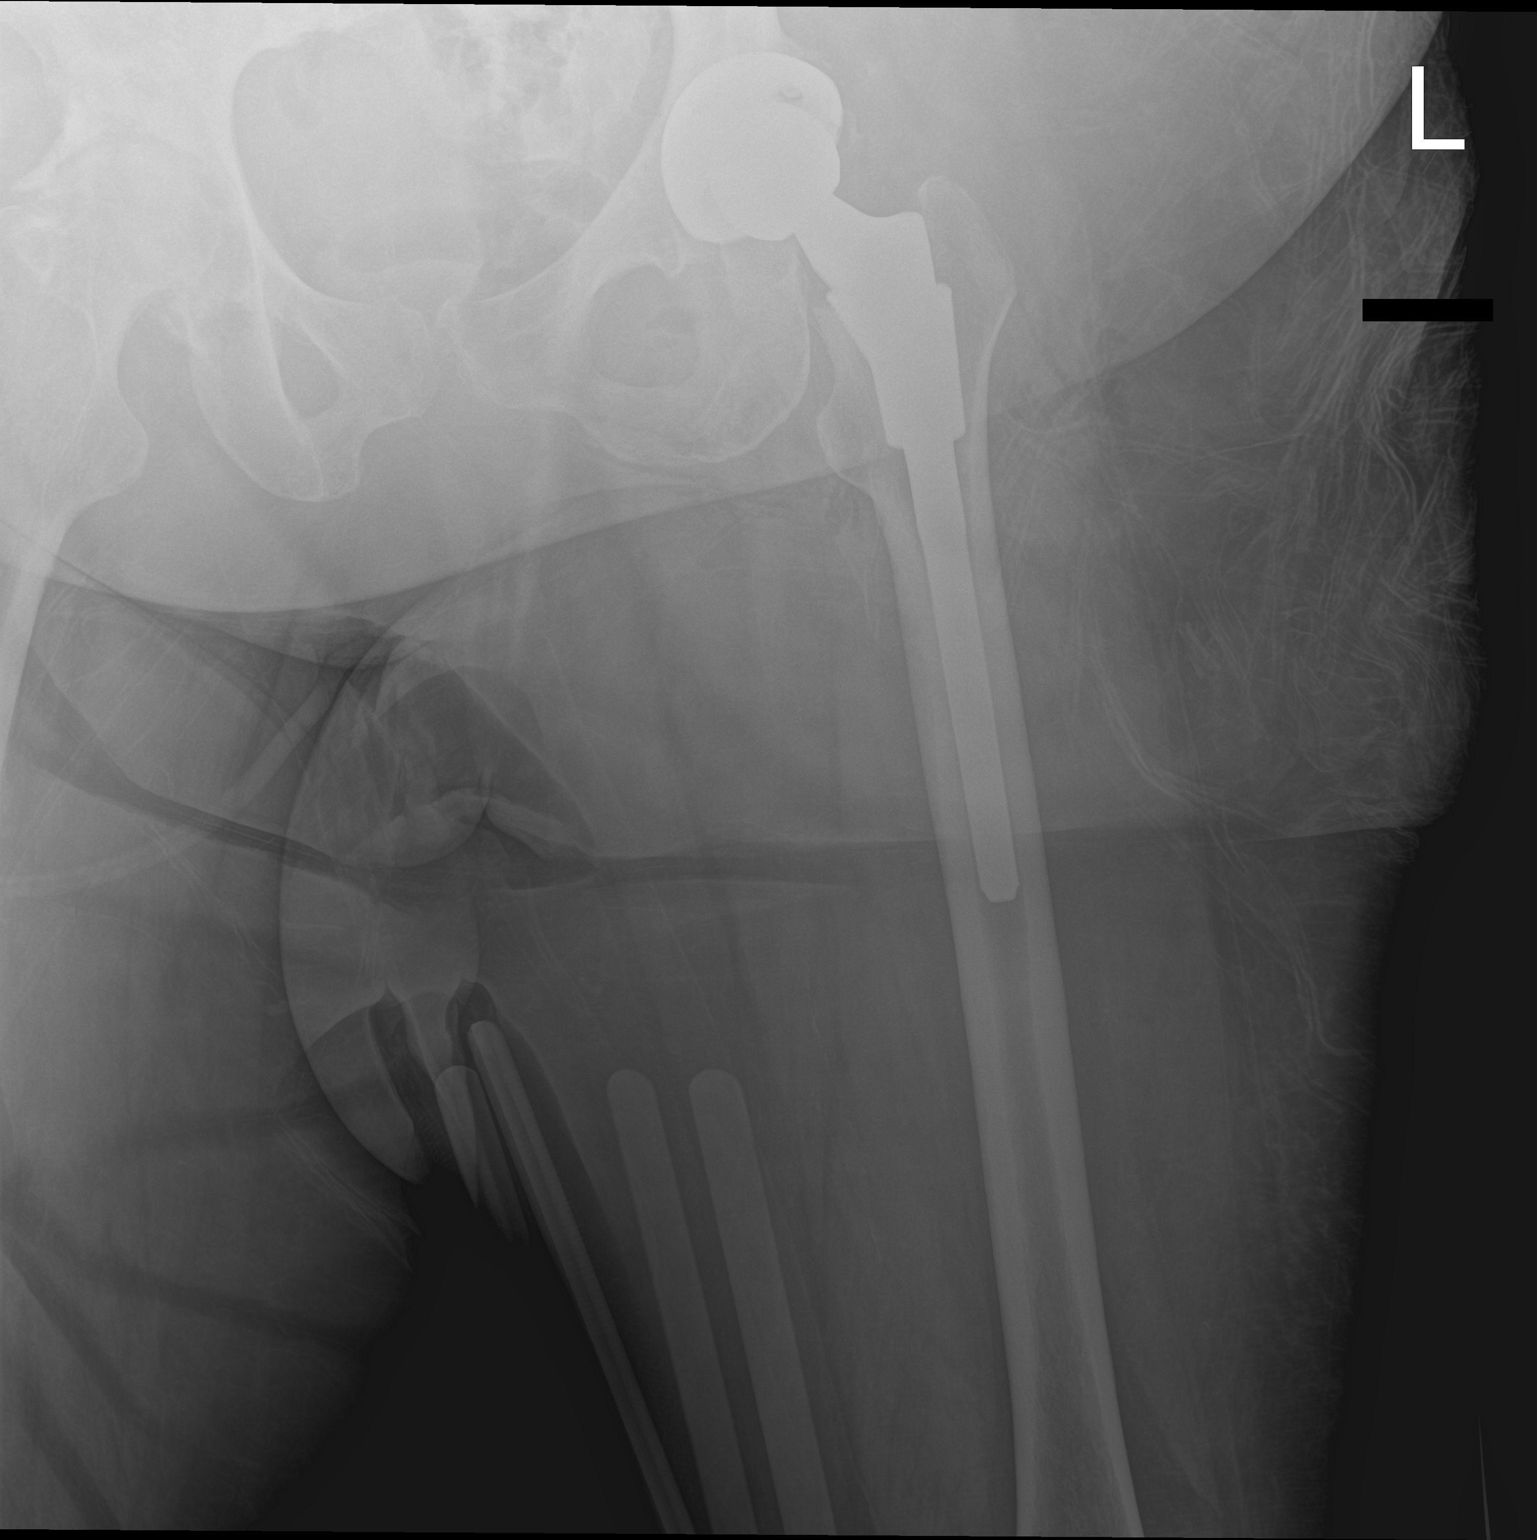

[2 of 2 positions shown; findings below may reference images not displayed]

FINDINGS: The patient is status post left total hip arthroplasty. Hardware
alignment is within expected limits.

There are advanced degenerative changes of the right hip. The SI
joints and symphysis pubis are intact.
IMPRESSION: Status post left total hip arthroplasty without evidence of
immediate postoperative complication.

## 2023-09-13 ENCOUNTER — Other Ambulatory Visit: Payer: Self-pay | Admitting: Family Medicine

## 2023-09-13 DIAGNOSIS — R102 Pelvic and perineal pain: Secondary | ICD-10-CM

## 2023-09-20 ENCOUNTER — Ambulatory Visit
Admission: RE | Admit: 2023-09-20 | Discharge: 2023-09-20 | Disposition: A | Discharge status: Home / Self Care | Payer: BC Managed Care – PPO | Source: Ambulatory Visit | Attending: Family Medicine | Admitting: Family Medicine

## 2023-09-20 DIAGNOSIS — R102 Pelvic and perineal pain: Secondary | ICD-10-CM

## 2024-01-25 DIAGNOSIS — I10 Essential (primary) hypertension: Secondary | ICD-10-CM | POA: Insufficient documentation

## 2024-01-25 DIAGNOSIS — R519 Headache, unspecified: Secondary | ICD-10-CM | POA: Insufficient documentation

## 2024-01-25 DIAGNOSIS — F410 Panic disorder [episodic paroxysmal anxiety] without agoraphobia: Secondary | ICD-10-CM | POA: Insufficient documentation

## 2024-01-25 DIAGNOSIS — F32A Depression, unspecified: Secondary | ICD-10-CM | POA: Insufficient documentation

## 2024-01-25 DIAGNOSIS — F5101 Primary insomnia: Secondary | ICD-10-CM | POA: Insufficient documentation
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
## 2024-02-08 ENCOUNTER — Ambulatory Visit: Payer: BC Managed Care – PPO | Attending: Rheumatology | Admitting: Rheumatology

## 2024-02-08 ENCOUNTER — Encounter: Payer: Self-pay | Admitting: Rheumatology

## 2024-02-08 ENCOUNTER — Telehealth: Payer: Self-pay

## 2024-02-08 VITALS — BP 119/82 | HR 83 | Resp 16 | Ht 60.25 in | Wt 268.0 lb

## 2024-02-08 DIAGNOSIS — M79641 Pain in right hand: Secondary | ICD-10-CM

## 2024-02-08 DIAGNOSIS — F410 Panic disorder [episodic paroxysmal anxiety] without agoraphobia: Secondary | ICD-10-CM

## 2024-02-08 DIAGNOSIS — M25561 Pain in right knee: Secondary | ICD-10-CM | POA: Diagnosis not present

## 2024-02-08 DIAGNOSIS — F5101 Primary insomnia: Secondary | ICD-10-CM

## 2024-02-08 DIAGNOSIS — Z96643 Presence of artificial hip joint, bilateral: Secondary | ICD-10-CM | POA: Diagnosis not present

## 2024-02-08 DIAGNOSIS — F32A Depression, unspecified: Secondary | ICD-10-CM

## 2024-02-08 DIAGNOSIS — M160 Bilateral primary osteoarthritis of hip: Secondary | ICD-10-CM

## 2024-02-08 DIAGNOSIS — M25562 Pain in left knee: Secondary | ICD-10-CM

## 2024-02-08 DIAGNOSIS — G8929 Other chronic pain: Secondary | ICD-10-CM

## 2024-02-08 DIAGNOSIS — R600 Localized edema: Secondary | ICD-10-CM

## 2024-02-08 DIAGNOSIS — Z96642 Presence of left artificial hip joint: Secondary | ICD-10-CM

## 2024-02-08 DIAGNOSIS — M545 Low back pain, unspecified: Secondary | ICD-10-CM

## 2024-02-08 DIAGNOSIS — I10 Essential (primary) hypertension: Secondary | ICD-10-CM

## 2024-02-08 DIAGNOSIS — R519 Headache, unspecified: Secondary | ICD-10-CM

## 2024-02-08 DIAGNOSIS — M79642 Pain in left hand: Secondary | ICD-10-CM

## 2024-02-08 DIAGNOSIS — F419 Anxiety disorder, unspecified: Secondary | ICD-10-CM

## 2024-02-08 NOTE — Patient Instructions (Addendum)
Osteoarthritis  Osteoarthritis is a type of arthritis. It refers to joint pain or joint disease. Osteoarthritis affects tissue that covers the ends of bones in joints (cartilage). Cartilage acts as a cushion between the bones and helps them move smoothly. Osteoarthritis occurs when cartilage in the joints gets worn down. Osteoarthritis is sometimes called "wear and tear" arthritis. Osteoarthritis is the most common form of arthritis. It often occurs in older people. It is a condition that gets worse over time. The joints most often affected by this condition are in the fingers, toes, hips, knees, and spine, including the neck and lower back. What are the causes? This condition is caused by the wearing down of cartilage that covers the ends of bones. What increases the risk? The following factors may make you more likely to develop this condition: Being age 25 or older. Obesity. Overuse of joints. Past injury of a joint. Past surgery on a joint. Family history of osteoarthritis. What are the signs or symptoms? The main symptoms of this condition are pain, swelling, and stiffness in the joint. Other symptoms may include: An enlarged joint. More pain and further damage caused by small pieces of bone or cartilage that break off and float inside of the joint. Small deposits of bone (osteophytes) that grow on the edges of the joint. A grating or scraping feeling inside the joint when you move it. Popping or creaking sounds when you move. Difficulty walking or exercising. An inability to grip items, twist your hand, or control the movements of your hands and fingers. How is this diagnosed? This condition may be diagnosed based on: Your medical history. A physical exam. Your symptoms. X-rays of the affected joints. Blood tests to rule out other types of arthritis. How is this treated? There is no cure for this condition, but treatment can help control pain and improve joint function. Treatment  may include a combination of therapies, such as: Pain relief techniques, such as: Applying heat and cold to the joint. Massage. A form of talk therapy called cognitive behavioral therapy (CBT). This therapy helps you set goals and follow up on the changes that you make. Medicines for pain and inflammation. The medicines can be taken by mouth or applied to the skin. They include: NSAIDs, such as ibuprofen. Prescription medicines. Strong anti-inflammatory medicines (corticosteroids). Certain nutritional supplements. A prescribed exercise program. You may work with a physical therapist. Assistive devices, such as a brace, wrap, splint, specialized glove, or cane. A weight control plan. Surgery, such as: An osteotomy. This is done to reposition the bones and relieve pain or to remove loose pieces of bone and cartilage. Joint replacement surgery. You may need this surgery if you have advanced osteoarthritis. Follow these instructions at home: Activity Rest your affected joints as told by your health care provider. Exercise as told by your provider. The provider may recommend specific types of exercise, such as: Strengthening exercises. These are done to strengthen the muscles that support joints affected by arthritis. Aerobic activities. These are exercises, such as brisk walking or water aerobics, that increase your heart rate. Range-of-motion activities. These help your joints move more easily. Balance and agility exercises. Managing pain, stiffness, and swelling     If told, apply heat to the affected area as often as told by your provider. Use the heat source that your provider recommends, such as a moist heat pack or a heating pad. If you have a removable assistive device, remove it as told by your provider. Place a  towel between your skin and the heat source. If your provider tells you to keep the assistive device on while you apply heat, place a towel between the assistive device and  the heat source. Leave the heat on for 20-30 minutes. If told, put ice on the affected area. If you have a removable assistive device, remove it as told by your provider. Put ice in a plastic bag. Place a towel between your skin and the bag. If your provider tells you to keep the assistive device on during icing, place a towel between the assistive device and the bag. Leave the ice on for 20 minutes, 2-3 times a day. If your skin turns bright red, remove the ice or heat right away to prevent skin damage. The risk of damage is higher if you cannot feel pain, heat, or cold. Move your fingers or toes often to reduce stiffness and swelling. Raise (elevate) the affected area above the level of your heart while you are sitting or lying down. General instructions Take over-the-counter and prescription medicines only as told by your provider. Maintain a healthy weight. Follow instructions from your provider for weight control. Do not use any products that contain nicotine or tobacco. These products include cigarettes, chewing tobacco, and vaping devices, such as e-cigarettes. If you need help quitting, ask your provider. Use assistive devices as told by your provider. Where to find more information [employer] of Arthritis and Musculoskeletal and Skin Diseases: niams.http://www.myers.net/ [employer] on Aging: BaseRingTones.pl American College of Rheumatology: rheumatology.org Contact a health care provider if: You have redness, swelling, or a feeling of warmth in a joint that gets worse. You have a fever along with joint or muscle aches. You develop a rash. You have trouble doing your normal activities. You have pain that gets worse and is not relieved by pain medicine. This information is not intended to replace advice given to you by your health care provider. Make sure you discuss any questions you have with your health care provider. Document Revised: 06/18/2022 Document Reviewed:  06/18/2022 Elsevier Patient Education  2024 [employer] Inc. Hand Exercises Hand exercises can be helpful for almost anyone. They can strengthen your hands and improve flexibility and movement. The exercises can also increase blood flow to the hands. These results can make your work and daily tasks easier for you. Hand exercises can be especially helpful for people who have joint pain from arthritis or nerve damage from using their hands over and over. These exercises can also help people who injure a hand. Exercises Most of these hand exercises are gentle stretching and motion exercises. It is usually safe to do them often throughout the day. Warming up your hands before exercise may help reduce stiffness. You can do this with gentle massage or by placing your hands in warm water for 10-15 minutes. It is normal to feel some stretching, pulling, tightness, or mild discomfort when you begin new exercises. In time, this will improve. Remember to always be careful and stop right away if you feel sudden, very bad pain or your pain gets worse. You want to get better and be safe. Ask your health care provider which exercises are safe for you. Do exercises exactly as told by your provider and adjust them as told. Do not begin these exercises until told by your provider. Knuckle bend or "claw" fist  Stand or sit with your arm, hand, and all five fingers pointed straight up. Make sure to keep your wrist straight. Gently bend  your fingers down toward your palm until the tips of your fingers are touching your palm. Keep your big knuckle straight and only bend the small knuckles in your fingers. Hold this position for 10 seconds. Straighten your fingers back to your starting position. Repeat this exercise 5-10 times with each hand. Full finger fist  Stand or sit with your arm, hand, and all five fingers pointed straight up. Make sure to keep your wrist straight. Gently bend your fingers into your palm until  the tips of your fingers are touching the middle of your palm. Hold this position for 10 seconds. Extend your fingers back to your starting position, stretching every joint fully. Repeat this exercise 5-10 times with each hand. Straight fist  Stand or sit with your arm, hand, and all five fingers pointed straight up. Make sure to keep your wrist straight. Gently bend your fingers at the big knuckle, where your fingers meet your hand, and at the middle knuckle. Keep the knuckle at the tips of your fingers straight and try to touch the bottom of your palm. Hold this position for 10 seconds. Extend your fingers back to your starting position, stretching every joint fully. Repeat this exercise 5-10 times with each hand. Tabletop  Stand or sit with your arm, hand, and all five fingers pointed straight up. Make sure to keep your wrist straight. Gently bend your fingers at the big knuckle, where your fingers meet your hand, as far down as you can. Keep the small knuckles in your fingers straight. Think of forming a tabletop with your fingers. Hold this position for 10 seconds. Extend your fingers back to your starting position, stretching every joint fully. Repeat this exercise 5-10 times with each hand. Finger spread  Place your hand flat on a table with your palm facing down. Make sure your wrist stays straight. Spread your fingers and thumb apart from each other as far as you can until you feel a gentle stretch. Hold this position for 10 seconds. Bring your fingers and thumb tight together again. Hold this position for 10 seconds. Repeat this exercise 5-10 times with each hand. Making circles  Stand or sit with your arm, hand, and all five fingers pointed straight up. Make sure to keep your wrist straight. Make a circle by touching the tip of your thumb to the tip of your index finger. Hold for 10 seconds. Then open your hand wide. Repeat this motion with your thumb and each of your  fingers. Repeat this exercise 5-10 times with each hand. Thumb motion  Sit with your forearm resting on a table and your wrist straight. Your thumb should be facing up toward the ceiling. Keep your fingers relaxed as you move your thumb. Lift your thumb up as high as you can toward the ceiling. Hold for 10 seconds. Bend your thumb across your palm as far as you can, reaching the tip of your thumb for the small finger (pinkie) side of your palm. Hold for 10 seconds. Repeat this exercise 5-10 times with each hand. Grip strengthening  Hold a stress ball or other soft ball in the middle of your hand. Slowly increase the pressure, squeezing the ball as much as you can without causing pain. Think of bringing the tips of your fingers into the middle of your palm. All of your finger joints should bend when doing this exercise. Hold your squeeze for 10 seconds, then relax. Repeat this exercise 5-10 times with each hand. Contact a health care  provider if: Your hand pain or discomfort gets much worse when you do an exercise. Your hand pain or discomfort does not improve within 2 hours after you exercise. If you have either of these problems, stop doing these exercises right away. Do not do them again unless your provider says that you can. Get help right away if: You develop sudden, severe hand pain or swelling. If this happens, stop doing these exercises right away. Do not do them again unless your provider says that you can. This information is not intended to replace advice given to you by your health care provider. Make sure you discuss any questions you have with your health care provider. Document Revised: 11/03/2022 Document Reviewed: 11/03/2022 Elsevier Patient Education  2024 [employer].

## 2024-02-08 NOTE — Telephone Encounter (Signed)
 Per Dr. Corliss Skains, if labs are normal, please call patient with results. Patient does not need a follow up appointment. Thanks!

## 2024-02-10 LAB — CYCLIC CITRUL PEPTIDE ANTIBODY, IGG: Cyclic Citrullin Peptide Ab: <16 U

## 2024-02-10 LAB — RHEUMATOID FACTOR: Rheumatoid fact SerPl-aCnc: <10 [IU]/mL (ref <14)

## 2024-02-10 LAB — SEDIMENTATION RATE: Sed Rate: 22 mm/h (ref 0–30)

## 2024-02-11 NOTE — Progress Notes (Signed)
 Sed rate normal, rheumatoid factor negative, anti-CCP negative

## 2024-02-11 NOTE — Telephone Encounter (Signed)
 Labs results normal and patient advised

## 2024-03-07 ENCOUNTER — Ambulatory Visit: Payer: BC Managed Care – PPO | Admitting: Rheumatology
